# Patient Record
Sex: Male | Born: 1950 | Race: White | Hispanic: No | Marital: Married
Health system: Midwestern US, Community
[De-identification: ages and names within clinical notes are randomized; demographics above are authoritative.]

## PROBLEM LIST (undated history)

## (undated) DIAGNOSIS — I519 Heart disease, unspecified: Secondary | ICD-10-CM

## (undated) DIAGNOSIS — G610 Guillain-Barre syndrome: Secondary | ICD-10-CM

## (undated) DIAGNOSIS — G6181 Chronic inflammatory demyelinating polyneuritis: Secondary | ICD-10-CM

## (undated) DIAGNOSIS — N200 Calculus of kidney: Secondary | ICD-10-CM

## (undated) DIAGNOSIS — M109 Gout, unspecified: Secondary | ICD-10-CM

## (undated) DIAGNOSIS — N400 Enlarged prostate without lower urinary tract symptoms: Secondary | ICD-10-CM

## (undated) DIAGNOSIS — K2960 Other gastritis without bleeding: Secondary | ICD-10-CM

## (undated) DIAGNOSIS — H269 Unspecified cataract: Secondary | ICD-10-CM

## (undated) DIAGNOSIS — I219 Acute myocardial infarction, unspecified: Secondary | ICD-10-CM

## (undated) DIAGNOSIS — R259 Unspecified abnormal involuntary movements: Secondary | ICD-10-CM

## (undated) DIAGNOSIS — I25118 Atherosclerotic heart disease of native coronary artery with other forms of angina pectoris: Secondary | ICD-10-CM

## (undated) DIAGNOSIS — M545 Low back pain, unspecified: Secondary | ICD-10-CM

## (undated) DIAGNOSIS — R310 Gross hematuria: Secondary | ICD-10-CM

## (undated) DIAGNOSIS — I214 Non-ST elevation (NSTEMI) myocardial infarction: Principal | ICD-10-CM

## (undated) HISTORY — PX: CORONARY ANGIOPLASTY WITH STENT PLACEMENT: SHX49

## (undated) HISTORY — PX: EYE SURGERY: SHX253

---

## 2011-02-02 ENCOUNTER — Encounter

## 2014-07-20 DIAGNOSIS — R519 Headache, unspecified: Secondary | ICD-10-CM | POA: Insufficient documentation

## 2015-01-03 ENCOUNTER — Inpatient Hospital Stay: Admit: 2015-01-04 | Payer: TRICARE (CHAMPUS) | Primary: Internal Medicine

## 2015-01-03 DIAGNOSIS — R972 Elevated prostate specific antigen [PSA]: Secondary | ICD-10-CM

## 2016-02-07 ENCOUNTER — Emergency Department: Admit: 2016-02-07 | Payer: TRICARE (CHAMPUS) | Primary: Internal Medicine

## 2016-02-07 ENCOUNTER — Inpatient Hospital Stay
Admit: 2016-02-07 | Discharge: 2016-02-09 | Disposition: A | Discharge status: Home / Self Care | Payer: TRICARE (CHAMPUS) | Attending: Family Medicine | Admitting: Family Medicine

## 2016-02-07 ENCOUNTER — Inpatient Hospital Stay

## 2016-02-07 DIAGNOSIS — I214 Non-ST elevation (NSTEMI) myocardial infarction: Principal | ICD-10-CM

## 2016-02-07 LAB — D-DIMER, QUANTITATIVE: D-Dimer, Quant: 0.28 ug/ml(FEU) (ref <0.46)

## 2016-02-07 LAB — CBC WITH AUTOMATED DIFF
ABS. BASOPHILS: 0 10*3/uL (ref 0.0–0.06)
ABS. EOSINOPHILS: 0.2 10*3/uL (ref 0.0–0.4)
ABS. LYMPHOCYTES: 2.3 10*3/uL (ref 0.9–3.6)
ABS. MONOCYTES: 0.6 10*3/uL (ref 0.05–1.2)
ABS. NEUTROPHILS: 4.5 10*3/uL (ref 1.8–8.0)
BASOPHILS: 1 % (ref 0–2)
EOSINOPHILS: 3 % (ref 0–5)
HCT: 47.4 % (ref 36.0–48.0)
HGB: 17.1 g/dL — ABNORMAL HIGH (ref 13.0–16.0)
LYMPHOCYTES: 31 % (ref 21–52)
MCH: 32.1 PG (ref 24.0–34.0)
MCHC: 36.1 g/dL (ref 31.0–37.0)
MCV: 88.9 FL (ref 74.0–97.0)
MONOCYTES: 8 % (ref 3–10)
MPV: 9.2 FL (ref 9.2–11.8)
NEUTROPHILS: 57 % (ref 40–73)
PLATELET: 200 10*3/uL (ref 135–420)
RBC: 5.33 M/uL (ref 4.70–5.50)
RDW: 13.3 % (ref 11.6–14.5)
WBC: 7.6 10*3/uL (ref 4.6–13.2)

## 2016-02-07 LAB — LIPASE: Lipase: 172 U/L (ref 73–393)

## 2016-02-07 LAB — PTT: aPTT: 26.5 s (ref 23.0–36.4)

## 2016-02-07 LAB — METABOLIC PANEL, COMPREHENSIVE
A-G Ratio: 0.9 (ref 0.8–1.7)
ALT (SGPT): 29 U/L (ref 16–61)
AST (SGOT): 43 U/L — ABNORMAL HIGH (ref 15–37)
Albumin: 3.7 g/dL (ref 3.4–5.0)
Alk. phosphatase: 80 U/L (ref 45–117)
Anion gap: 10 mmol/L (ref 3.0–18)
BUN/Creatinine ratio: 14 (ref 12–20)
BUN: 17 MG/DL (ref 7.0–18)
Bilirubin, total: 0.4 MG/DL (ref 0.2–1.0)
CO2: 26 mmol/L (ref 21–32)
Calcium: 8.9 MG/DL (ref 8.5–10.1)
Chloride: 106 mmol/L (ref 100–108)
Creatinine: 1.21 MG/DL (ref 0.6–1.3)
GFR est AA: >60 mL/min/{1.73_m2} (ref >60)
GFR est non-AA: >60 mL/min/{1.73_m2} (ref >60)
Globulin: 3.9 g/dL (ref 2.0–4.0)
Glucose: 120 mg/dL — ABNORMAL HIGH (ref 74–99)
Potassium: 3.9 mmol/L (ref 3.5–5.5)
Protein, total: 7.6 g/dL (ref 6.4–8.2)
Sodium: 142 mmol/L (ref 136–145)

## 2016-02-07 LAB — CARDIAC PANEL,(CK, CKMB & TROPONIN)
CK - MB: 8.6 ng/ml — ABNORMAL HIGH (ref <3.6)
CK-MB Index: 3.2 % (ref 0.0–4.0)
CK: 269 U/L (ref 39–308)
Troponin-I, QT: 1.04 NG/ML — CR (ref 0.00–0.06)

## 2016-02-07 LAB — MAGNESIUM: Magnesium: 1.9 mg/dL (ref 1.6–2.6)

## 2016-02-07 LAB — D DIMER: D DIMER: 0.28 ug/ml(FEU) (ref <0.46)

## 2016-02-07 MED ORDER — METOPROLOL TARTRATE 25 MG TAB
25 mg | Freq: Once | ORAL | Status: DC
Start: 2016-02-07 — End: 2016-02-07
  Administered 2016-02-07: 22:00:00 via ORAL

## 2016-02-07 MED ORDER — NITROGLYCERIN 2 % TRANSDERMAL OINTMENT
2 % | Freq: Two times a day (BID) | TRANSDERMAL | Status: DC
Start: 2016-02-07 — End: 2016-02-09
  Administered 2016-02-07 – 2016-02-09 (×3): via TOPICAL

## 2016-02-07 MED ORDER — METOPROLOL TARTRATE 25 MG TAB
25 mg | Freq: Once | ORAL | Status: AC
Start: 2016-02-07 — End: 2016-02-07
  Administered 2016-02-07: 23:00:00 via ORAL

## 2016-02-07 MED ORDER — ACETAMINOPHEN-CODEINE 300 MG-30 MG TAB
300-30 mg | ORAL | Status: AC
Start: 2016-02-07 — End: 2016-02-07
  Administered 2016-02-07: via ORAL

## 2016-02-07 MED ORDER — ASPIRIN 81 MG CHEWABLE TAB
81 mg | Freq: Every day | ORAL | Status: DC
Start: 2016-02-07 — End: 2016-02-09
  Administered 2016-02-08 – 2016-02-09 (×2): via ORAL

## 2016-02-07 MED ORDER — HEPARIN (PORCINE) 1,000 UNIT/ML IJ SOLN
1000 unit/mL | Freq: Once | INTRAMUSCULAR | Status: AC
Start: 2016-02-07 — End: 2016-02-07
  Administered 2016-02-07: 23:00:00 via INTRAVENOUS

## 2016-02-07 MED ORDER — HEPARIN (PORCINE) IN D5W 25,000 UNIT/250 ML IV
25000 unit/250 mL(100 unit/mL) | INTRAVENOUS | Status: DC
Start: 2016-02-07 — End: 2016-02-08
  Administered 2016-02-07 – 2016-02-08 (×5): via INTRAVENOUS

## 2016-02-07 MED ORDER — ASPIRIN 81 MG CHEWABLE TAB
81 mg | ORAL | Status: AC
Start: 2016-02-07 — End: 2016-02-07
  Administered 2016-02-07: 23:00:00 via ORAL

## 2016-02-07 MED ORDER — NITROGLYCERIN 0.4 MG SUBLINGUAL TAB
0.4 mg | SUBLINGUAL | Status: DC | PRN
Start: 2016-02-07 — End: 2016-02-09

## 2016-02-07 MED ORDER — ATORVASTATIN 20 MG TAB
20 mg | ORAL | Status: AC
Start: 2016-02-07 — End: 2016-02-07
  Administered 2016-02-07: 23:00:00 via ORAL

## 2016-02-07 MED ORDER — METOPROLOL TARTRATE 25 MG TAB
25 mg | Freq: Two times a day (BID) | ORAL | Status: DC
Start: 2016-02-07 — End: 2016-02-09
  Administered 2016-02-08 – 2016-02-09 (×3): via ORAL

## 2016-02-07 MED ORDER — ATORVASTATIN 20 MG TAB
20 mg | Freq: Every evening | ORAL | Status: DC
Start: 2016-02-07 — End: 2016-02-09
  Administered 2016-02-09: 01:00:00 via ORAL

## 2016-02-07 MED FILL — ATORVASTATIN 20 MG TAB: 20 mg | ORAL | Qty: 2

## 2016-02-07 MED FILL — ASPIRIN 81 MG CHEWABLE TAB: 81 mg | ORAL | Qty: 4

## 2016-02-07 MED FILL — METOPROLOL TARTRATE 25 MG TAB: 25 mg | ORAL | Qty: 1

## 2016-02-07 MED FILL — NITRO-BID 2 % TRANSDERMAL OINTMENT: 2 % | TRANSDERMAL | Qty: 1

## 2016-02-07 MED FILL — HEPARIN (PORCINE) 1,000 UNIT/ML IJ SOLN: 1000 unit/mL | INTRAMUSCULAR | Qty: 10

## 2016-02-07 MED FILL — ACETAMINOPHEN-CODEINE 300 MG-30 MG TAB: 300-30 mg | ORAL | Qty: 1

## 2016-02-07 MED FILL — HEPARIN (PORCINE) IN D5W 25,000 UNIT/250 ML IV: 25000 unit/250 mL(100 unit/mL) | INTRAVENOUS | Qty: 250

## 2016-02-07 NOTE — Consults (Signed)
[facility] MEDICAL CENTER  CONSULTATIONS    Name:  Shane Mann, Shane Mann  MR#:  [id number]  DOB:  30-Dec-1950  Account #:  [id number]  Date of Adm:  02/07/2016  Date of Consultation:  02/07/2016      REASON FOR CONSULTATION: Non-ST-elevation MI and chest  pain.    HISTORY OF PRESENT ILLNESS: This patient is a 65 year old  gentleman with a history of gout, gastroesophageal reflux disease,  sleep apnea, and a remote history of GB syndrome, came to the  hospital with worsening of chest pain symptoms. Actually his  symptoms actually started 5 weeks ago when he has 3-5 minute  episode of chest pain that resolved, and then a week ago he started  having again frequent chest pain, then yesterday the chest pain lasted  for 45 minutes and the chest pain was precordial and sometimes  radiating to both shoulders, became worse on physical activity,  complaining of minimal dizziness. 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 The  patient has significant family history of coronary artery disease as well.  Denied any risk factor for DVT or pulmonary embolism.    PAST MEDICAL HISTORY: Significant for sleep apnea, Guillain-Barre  syndrome, gout, gastroesophageal reflux.    SURGICAL HISTORY: Significant for cataract surgery.    FAMILY HISTORY: Positive for a history of MI in mother and father, as  well as in the sister.    SOCIAL HISTORY: Denied any history of smoking or alcohol or drug  abuse.    REVIEW OF SYSTEMS: A 10-point review of systems completed,  positive pertinent findings discussed in history of present illness. The  rest of the systems are normal.    ALLERGIES: THE PATIENT HAS NO KNOWN DRUG ALLERGIES.    HOME MEDICATIONS INCLUDE  1. Neurontin 300 mg daily.  2. Ibuprofen.  3. Vitamin D3.  4. Cymbalta.  5. Colchicine.  6. Allopurinol.    PHYSICAL EXAMINATION  GENERAL: A 65 year old gentleman,  comfortable, not in any distress,  not in active chest pain at this point.  VITAL SIGNS: Heart rate is 77, respirations 15, temperature is 98.5,  blood pressure is 111/82 mmHg. O2 saturation is 94% to 100%.  HEENT: Head is atraumatic, normocephalic.  NECK: Supple. No JVD, no carotid bruits.  HEART: S1, S2 audible.  LUNGS: Bilateral air entry positive, no added sounds.  ABDOMEN: Soft, nontender. Bowel sounds audible.  EXTREMITIES: No edema. Pulses are palpable.  NEUROLOGIC: No focal motor or sensory deficit.  DERMATOLOGICAL: No skin rash.  MUSCULOSKELETAL: No obvious joint deformity. All the pulses are  palpable. There is no radial-radial and radial-femoral delay in the  pulses also.    LABORATORY DATA: EKG has shown sinus rhythm, possible inferior  infarct cannot be excluded.    Troponin is 1.0, and CK-MB is 8.6. Total CK is 269. Sodium 142,  potassium 3.9, creatinine is 1.21. Magnesium is 1.9. Hemoglobin is  17.1, platelet count is 200.    Chest x-ray is stable.    CT head was negative for any bleed.    ASSESSMENT  1. Non-ST elevation myocardial infarction with possible delayed  presentation, intermittent chest pain and troponin elevation.  2. Sleep apnea.  3. History of Guillain-Barre syndrome.    PLAN: I will start the patient on heparin ACS protocol. Will start on  metoprolol 12.5 b.i.d., will start Lipitor 40 mg daily,  aspirin daily. I will  get the echocardiogram. The patient is stable at this point and we will  plan for cardiac catheterization tomorrow. Risks, benefits and  alternatives were discussed in detail with the patient, wife, and  daughter, they all agreed to proceed, and I will follow the patient.  Treatment plan was discussed with Dr. Lucianne MussKumar.        Asa SaunasMASOOD Dymond Spreen, MD    MA / [email or web address]  D:  02/07/2016   19:35  T:  02/07/2016   20:25  Job #:  [id number]

## 2016-02-07 NOTE — ED Provider Notes (Signed)
[facility]  EMERGENCY DEPARTMENT HISTORY AND PHYSICAL EXAM       Date: 02/07/2016   Patient Name: Shane Mann   Date of Birth: 1951/04/16  Medical Record Number: [id number]    History of Presenting Illness     Chief Complaint   Patient presents with   ??? Chest Pain        History Provided By:  patient    Additional History: 3:58 PM   Shane Mann is a 65 y.o. male who presents to the emergency department C/O recurrent 3 minute episodes of left sided chest pain starting 5 weeks ago which have been increasingly more frequent and more severe. Pt's last episode of chest pain was this morning and lasted 45 minutes. This episode was associated with severe shortness of breath, diaphoresis, left sided facial numbness, and upper back pain. Associated sxs include cough starting 7 days ago, intermittent dizziness, and right foot swelling. Pt went to his PCP, Aaron Mose, DO, for his sxs earlier today and he was evaluated by EKG which found T wave abnormality. This is new compared to his prior EKGs. The patient was instructed to come to the ED for further evaluation. Denies blood thinner use, including ASA. Denies recent travel or hx of DVT. PMHx includes Guillain Barre syndrome. Denies cigarette use, etoh use, and illicit drug use. Denies fever, chills, speech difficulties, weakness, paralysis, and any other sxs or complaints.     Primary Care Provider: Harlene Ramus, MD   Specialist:    Past History     Past Medical History:   Past Medical History:   Diagnosis Date   ??? GERD (gastroesophageal reflux disease)    ??? Gout    ??? Guillain Barr?? syndrome ([place])    ??? Sleep apnea         Past Surgical History:   Past Surgical History:   Procedure Laterality Date   ??? HX CATARACT REMOVAL          Family History:   History reviewed. No pertinent family history.     Social History:   Social History   Substance Use Topics   ??? Smoking status: None   ??? Smokeless tobacco: None   ??? Alcohol use None         Allergies:   No Known Allergies     Review of Systems   Review of Systems   Constitutional: Positive for diaphoresis. Negative for chills and fever.   Respiratory: Positive for shortness of breath.    Cardiovascular: Positive for chest pain (left sided) and leg swelling (right foot).   Neurological: Positive for dizziness (intermittent) and numbness (left sided facial numbness). Negative for speech difficulty and weakness.        (-) paralysis   All other systems reviewed and are negative.      Physical Exam  Vitals:    02/07/16 1505 02/07/16 1615   BP: 157/90 142/70   Pulse: 99 86   Resp: 16 23   Temp: 98.5 ??F (36.9 ??C)    SpO2: 100% 93%   Weight: 107.5 kg (237 lb)    Height: 5' 11" (1.803 m)        Physical Exam   Constitutional: He is oriented to person, place, and time. He appears well-developed and well-nourished.   HENT:   Head: Normocephalic and atraumatic.   Right Ear: External ear normal.   Left Ear: External ear normal.   Nose: Nose normal.   Mouth/Throat:  Oropharynx is clear and moist.   Eyes: Conjunctivae and EOM are normal. Pupils are equal, round, and reactive to light.   Neck: Normal range of motion. Neck supple. No JVD present. No tracheal deviation present. No thyromegaly present.   Cardiovascular: Normal rate, regular rhythm, normal heart sounds and intact distal pulses.  Exam reveals no gallop and no friction rub.    No murmur heard.  Pulmonary/Chest: Effort normal. No respiratory distress. He has wheezes (scattered, diffuse). He has no rales.   Abdominal: Soft. Bowel sounds are normal. He exhibits no distension and no mass. There is no tenderness.   Obese, No HSM   Musculoskeletal: Normal range of motion. He exhibits no edema or tenderness.        Left ankle: He exhibits swelling (mild swelling with no tenderness, erythema, edema, or warmth).   Lymphadenopathy:     He has no cervical adenopathy.   Neurological: He is alert and oriented to person, place, and time. He has  normal reflexes. No cranial nerve deficit. He exhibits normal muscle tone. Coordination normal.   No focal weakness   Skin: Skin is warm and dry. No rash noted.   Psychiatric: He has a normal mood and affect. His behavior is normal. Thought content normal.   Nursing note and vitals reviewed.      Diagnostic Study Results     Labs -      Recent Results (from the past 12 hour(s))   EKG, 12 LEAD, INITIAL    Collection Time: 02/07/16  4:01 PM   Result Value Ref Range    Ventricular Rate 89 BPM    Atrial Rate 89 BPM    P-R Interval 162 ms    QRS Duration 84 ms    Q-T Interval 368 ms    QTC Calculation (Bezet) 447 ms    Calculated P Axis -3 degrees    Calculated R Axis 11 degrees    Calculated T Axis 78 degrees    Diagnosis       Normal sinus rhythm  Possible Inferior infarct , age undetermined  Abnormal ECG  No previous ECGs available     CBC WITH AUTOMATED DIFF    Collection Time: 02/07/16  4:15 PM   Result Value Ref Range    WBC 7.6 4.6 - 13.2 K/uL    RBC 5.33 4.70 - 5.50 M/uL    HGB 17.1 (H) 13.0 - 16.0 g/dL    HCT 47.4 36.0 - 48.0 %    MCV 88.9 74.0 - 97.0 FL    MCH 32.1 24.0 - 34.0 PG    MCHC 36.1 31.0 - 37.0 g/dL    RDW 13.3 11.6 - 14.5 %    PLATELET 200 135 - 420 K/uL    MPV 9.2 9.2 - 11.8 FL    NEUTROPHILS 57 40 - 73 %    LYMPHOCYTES 31 21 - 52 %    MONOCYTES 8 3 - 10 %    EOSINOPHILS 3 0 - 5 %    BASOPHILS 1 0 - 2 %    ABS. NEUTROPHILS 4.5 1.8 - 8.0 K/UL    ABS. LYMPHOCYTES 2.3 0.9 - 3.6 K/UL    ABS. MONOCYTES 0.6 0.05 - 1.2 K/UL    ABS. EOSINOPHILS 0.2 0.0 - 0.4 K/UL    ABS. BASOPHILS 0.0 0.0 - 0.06 K/UL    DF AUTOMATED     METABOLIC PANEL, COMPREHENSIVE    Collection Time: 02/07/16  4:15 PM   Result Value  Ref Range    Sodium 142 136 - 145 mmol/L    Potassium 3.9 3.5 - 5.5 mmol/L    Chloride 106 100 - 108 mmol/L    CO2 26 21 - 32 mmol/L    Anion gap 10 3.0 - 18 mmol/L    Glucose 120 (H) 74 - 99 mg/dL    BUN 17 7.0 - 18 MG/DL    Creatinine 1.21 0.6 - 1.3 MG/DL    BUN/Creatinine ratio 14 12 - 20       GFR est AA >60 >60 ml/min/1.59m    GFR est non-AA >60 >60 ml/min/1.746m   Calcium 8.9 8.5 - 10.1 MG/DL    Bilirubin, total 0.4 0.2 - 1.0 MG/DL    ALT (SGPT) 29 16 - 61 U/L    AST (SGOT) 43 (H) 15 - 37 U/L    Alk. phosphatase 80 45 - 117 U/L    Protein, total 7.6 6.4 - 8.2 g/dL    Albumin 3.7 3.4 - 5.0 g/dL    Globulin 3.9 2.0 - 4.0 g/dL    A-G Ratio 0.9 0.8 - 1.7     CARDIAC PANEL,(CK, CKMB & TROPONIN)    Collection Time: 02/07/16  4:15 PM   Result Value Ref Range    CK 269 39 - 308 U/L    CK - MB 8.6 (H) <3.6 ng/ml    CK-MB Index 3.2 0.0 - 4.0 %    Troponin-I, Qt. 1.04 (HH) 0.00 - 0.06 NG/ML   MAGNESIUM    Collection Time: 02/07/16  4:15 PM   Result Value Ref Range    Magnesium 1.9 1.6 - 2.6 mg/dL   LIPASE    Collection Time: 02/07/16  4:15 PM   Result Value Ref Range    Lipase 172 73 - 393 U/L   D DIMER    Collection Time: 02/07/16  4:15 PM   Result Value Ref Range    D DIMER 0.28 <0.46 ug/ml(FEU)       Radiologic Studies -  The following have been ordered and reviewed:  CT HEAD WO CONT   Final Result   IMPRESSION:  ??  1. No acute intracranial abnormalities are identified.   ??  2. Moderate mucosal thickening throughout the paranasal sinuses with near total  opacification of the frontal sinuses. Aerated mastoids. Correlate clinically for  sinusitis contributing to headaches.  As read by the radiologist.    XR CHEST PA LAT   Final Result   IMPRESSION:  ??  1. No acute pulmonary process.  As read by the radiologist.      Medical Decision Making   I am the first provider for this patient.     I reviewed the vital signs, available nursing notes, past medical history, past surgical history, family history and social history.     Vital Signs-Reviewed the patient's vital signs.   Patient Vitals for the past 12 hrs:   Temp Pulse Resp BP SpO2   02/07/16 1615 - 86 23 142/70 93 %   02/07/16 1505 98.5 ??F (36.9 ??C) 99 16 157/90 100 %       Pulse Oximetry Analysis - Normal 92% on room air     Cardiac Monitor:   Rate: 85 bpm   Rhythm: Normal Sinus Rhythm     EKG interpretation: (Preliminary)  Rhythm: NSR. Rate (approx.): 89 bpm; Possible inferior infarct, age undetermined. No STEMI. Slight change in T waves reverted back in V2. Compared to EKG from 02/07/16 at  13:54 PM.   EKG read by Robbie Lis, MD    Old Medical Records: Old medical records.  Previous electrocardiograms.  Nursing notes.     Provider Notes: Ddx: ACS, CHF, MI, PE, pneumonia, pneumothorax, AAA, dissection, esophageal spasms, GERD, anemia, uremia. Rule out: other cardiovascular, pulmonary, or GI pathology.    Facial symptoms may be due to cardiac etiology. Rule out intracranial pathology, sinusitis, mass    Procedures:   Procedures    ED Course:  3:58 PM  Initial assessment performed. The patients presenting problems have been discussed, and they are in agreement with the care plan formulated and outlined with them.  I have encouraged them to ask questions as they arise throughout their visit.    6:04 PM Discussed patient's history, exam, and available diagnostics results with Jeneen Rinks A. Grandville Silos, DO, internal medicine, who agrees to admit the patient to telemetry.    6:07 PM  Patient is being admitted to the hospital by Jeneen Rinks A. Grandville Silos, DO to Telemetry. The results of their tests and reasons for their admission have been discussed with them and/or available family. They convey agreement and understanding for the need to be admitted and for their admission diagnosis.      6:12 PM Discussed patient's history, exam, and available diagnostics results with Ival Bible, MD, cardiology, who would like the patient to be started on Metoprolol 12.22m, Lipitor 40 mg PO, 4 baby ASA, Heparin Bolus, Heparin drip, and admit to the hospitalist overnight. He will consult.     Medications Given in the ED:  Medications   aspirin chewable tablet 324 mg (not administered)   nitroglycerin (NITROSTAT) tablet 0.4 mg (not administered)    metoprolol tartrate (LOPRESSOR) tablet 12.5 mg (not administered)       Critical Care Time:  I have spent 40 minutes of critical care time involved in lab review, consultations with specialist, family decision-making, and documentation.  During this entire length of time I was immediately available to the patient.    Critical Care:  The reason for providing this level of medical care for this critically ill patient was due a critical illness that impaired one or more vital organ systems such that there was a high probability of imminent or life threatening deterioration in the patients condition. This care involved high complexity decision making to assess, manipulate, and support vital system functions, to treat this degreee vital organ system failure and to prevent further life threatening deterioration of the patient???s condition.     Diagnosis   Clinical Impression:     1. NSTEMI (non-ST elevated myocardial infarction) ([place]    2. Unstable angina pectoris ([place]       _______________________________   Attestations:     This note is prepared by CNed Clines acting as a Scribe for PRobbie Lis MD on 3:58 PM on 02/07/2016 .    PRobbie Lis MD: The scribe's documentation has been prepared under my direction and personally reviewed by me in its entirety.  _______________________________

## 2016-02-07 NOTE — Consults (Signed)
[facility] MEDICAL CENTER  CONSULTATIONS    Name:  Shane Mann, FRANEK  MR#:  [id number]  DOB:  Dec 29, 1950  Account #:  [id number]  Date of Adm:  02/07/2016  Date of Consultation:  02/07/2016      REASON FOR CONSULTATION: Non-ST-elevation MI and chest  pain.    HISTORY OF PRESENT ILLNESS: This patient is a 65 year old  gentleman with a history of gout, gastroesophageal reflux disease,  sleep apnea, and a remote history of GB syndrome, came to the  hospital with worsening of chest pain symptoms. Actually his  symptoms actually started 5 weeks ago when he has 3-5 minute  episode of chest pain that resolved, and then a week ago he started  having again frequent chest pain, then yesterday the chest pain lasted  for 45 minutes and the chest pain was precordial and sometimes  radiating to both shoulders, became worse on physical activity,  complaining of minimal dizziness. 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 The  patient has significant family history of coronary artery disease as well.  Denied any risk factor for DVT or pulmonary embolism.    PAST MEDICAL HISTORY: Significant for sleep apnea, Guillain-Barre  syndrome, gout, gastroesophageal reflux.    SURGICAL HISTORY: Significant for cataract surgery.    FAMILY HISTORY: Positive for a history of MI in mother and father, as  well as in the sister.    SOCIAL HISTORY: Denied any history of smoking or alcohol or drug  abuse.    REVIEW OF SYSTEMS: A 10-point review of systems completed,  positive pertinent findings discussed in history of present illness. The  rest of the systems are normal.    ALLERGIES: THE PATIENT HAS NO KNOWN DRUG ALLERGIES.    HOME MEDICATIONS INCLUDE  1. Neurontin 300 mg daily.  2. Ibuprofen.  3. Vitamin D3.  4. Cymbalta.  5. Colchicine.  6. Allopurinol.    PHYSICAL EXAMINATION   GENERAL: A 65 year old gentleman, comfortable, not in any distress,  not in active chest pain at this point.  VITAL SIGNS: Heart rate is 77, respirations 15, temperature is 98.5,  blood pressure is 111/82 mmHg. O2 saturation is 94% to 100%.  HEENT: Head is atraumatic, normocephalic.  NECK: Supple. No JVD, no carotid bruits.  HEART: S1, S2 audible.  LUNGS: Bilateral air entry positive, no added sounds.  ABDOMEN: Soft, nontender. Bowel sounds audible.  EXTREMITIES: No edema. Pulses are palpable.  NEUROLOGIC: No focal motor or sensory deficit.  DERMATOLOGICAL: No skin rash.  MUSCULOSKELETAL: No obvious joint deformity. All the pulses are  palpable. There is no radial-radial and radial-femoral delay in the  pulses also.    LABORATORY DATA: EKG has shown sinus rhythm, possible inferior  infarct cannot be excluded.    Troponin is 1.0, and CK-MB is 8.6. Total CK is 269. Sodium 142,  potassium 3.9, creatinine is 1.21. Magnesium is 1.9. Hemoglobin is  17.1, platelet count is 200.    Chest x-ray is stable.    CT head was negative for any bleed.    ASSESSMENT  1. Non-ST elevation myocardial infarction with possible delayed  presentation, intermittent chest pain and troponin elevation.  2. Sleep apnea.  3. History of Guillain-Barre syndrome.    PLAN: I will start the patient on heparin ACS protocol. Will start on  metoprolol 12.5 b.i.d., will start Lipitor 40 mg daily,  aspirin daily. I will  get the echocardiogram. The patient is stable at this point and we will  plan for cardiac catheterization tomorrow. Risks, benefits and  alternatives were discussed in detail with the patient, wife, and  daughter, they all agreed to proceed, and I will follow the patient.  Treatment plan was discussed with Dr. Lucianne MussKumar.        Asa SaunasMASOOD Emmitte Surgeon, MD    MA / [email or web address]  D:  02/07/2016   19:35  T:  02/07/2016   20:25  Job #:  [id number]

## 2016-02-07 NOTE — ED Notes (Signed)
Dr Ninfa Lindenahmad at [facility]side examiing pt and discussing plan of care

## 2016-02-07 NOTE — ED Notes (Signed)
Sepsis Screening completed    (  )Patient meets SIRS criteria.  ( x )Patient does not meet SIRS criteria.      SIRS Criteria is achieved when two or more of the following are present  ? Temperature < 96.8??F (36??C) or > 100.9??F (38.3??C)  ? Heart Rate > 90 beats per minute  ? Respiratory Rate > 20 breaths per minute  ? WBC count > 12,000 or <4,000 or > 10% bands

## 2016-02-07 NOTE — H&P (Signed)
History and Physical    Patient: Shane Mann               Sex: male          DOA: 02/07/2016       Date of Birth:  02/27/1951      Age:  65 y.o.            Assessment/Plan     NSTEMI with exertional angina  Tn=1, with active chest pain and EKG changes  Bed rest, serial Tn and EKG, Tele, TTE  Heparin gtt  ASA, BB, Statin  Cardiology consulting Imminent cardiac cath    OSA-CPAP  Gout - hold allopurinol, colcrys  ED - on PDE5s none recent  GERD - PPI  Hx of GBS with neuropathy - resume neurontin and     Code status: Full  PPX:  DVT: Hepgtt   GI: Pepcid PO    HPI:     Chief Complaint   Patient presents with   ??? Chest Pain       Shane Mann is a 65 y.o. male with PMHX of OSA, Gout, ED, GERD, and GBS with neuropathy who presents from Dr Rubbie Battiest office this afternoon. He reports a long and worrysome history of worsening substernal chest pain which began 5 weeks ago while working outside.  2 wks ago the sub sternal pain was more prominent, radiating to his back and lasting 3-5 minutes. Yesterday the pain began getting severe with associated DOE.  Today was the worst when he had an hour of post exertional pain  After going up steps associated with light headedness.  He was seen in Dr Rubbie Battiest office and ECG x1 performed and immediately sent over to [facility] for evaluation.  ECG in Dr Rubbie Battiest office 1:50pm shows 0.61m ST elevation and biphasic t waves in V1-V2  with persistent q waves in III and aVF.  These ST changes are gone on initial ECG in ER at 4pm, [facility]        Past Medical History:   Diagnosis Date   ??? GERD (gastroesophageal reflux disease)    ??? Gout    ??? Guillain Barr?? syndrome ([facility]Knox    ??? Sleep apnea        Prior to Admission Medications   Prescriptions Last Dose Informant Patient Reported? Taking?   DULoxetine (CYMBALTA) 30 mg capsule   Yes Yes   Sig: Take 30 mg by mouth two (2) times a day.   allopurinol (ZYLOPRIM) 100 mg tablet   Yes Yes    Sig: Take 200 mg by mouth daily.   cholecalciferol, vitamin D3, (VITAMIN D3) 2,000 unit tab   Yes Yes   Sig: Take 2,000 Int'l Units by mouth daily.   colchicine (COLCRYS) 0.6 mg tablet   Yes Yes   Sig: Take 0.6 mg by mouth daily.   gabapentin (NEURONTIN) 300 mg capsule   Yes Yes   Sig: Take 300 mg by mouth four (4) times daily.   ibuprofen (MOTRIN) 400 mg tablet   Yes Yes   Sig: Take 400 mg by mouth every six (6) hours as needed for Pain.      Facility-Administered Medications: None       Social History:  Social History     Social History   ??? Marital status: MARRIED     Spouse name: N/A   ??? Number of children: N/A   ??? Years of  education: N/A     Occupational History   ??? Not on file.     Social History Main Topics   ??? Smoking status: Not on file   ??? Smokeless tobacco: Not on file   ??? Alcohol use Not on file   ??? Drug use: Not on file   ??? Sexual activity: Not on file     Other Topics Concern   ??? Not on file     Social History Narrative   ??? No narrative on file       Family History:  History reviewed. No pertinent family history.    Surgical History:  Past Surgical History:   Procedure Laterality Date   ??? HX CATARACT REMOVAL         Review of Systems  Constitutional:  No fever or weight loss  HEENT:  No headache or visual changes  Cardiovascular: HPI  Respiratory:  No coughing, wheezing, or shortness of breath.  GI:  No nausea or vomitting.  No diarrhea  GU:  No hematuria or dysuria  Skin:  No rashes or moles  Neuro:  No seizures or syncope  Hematological:  No bruising or bleeding  Endocrine:  No diabetes or thyroid disease    Physical Exam:      Visit Vitals   ??? BP 136/81 (BP 1 Location: Left arm, BP Patient Position: At rest)   ??? Pulse 76   ??? Temp 97.9 ??F (36.6 ??C)   ??? Resp 16   ??? Ht '5\' 11"'$  (1.803 m)   ??? Wt 107.5 kg (237 lb)   ??? SpO2 96%   ??? BMI 33.05 kg/m2       Physical Exam:  Gen:  No distress, alert  HEENT:  Normal cephalic atraumatic, extra-occular movements are intact.  Neck:  Supple, No JVD   Lungs:  Clear bilaterally, no wheeze, no rales, normal effort  Heart:  Regular Rate and Rhythm, normal S1 and S2, no edema  Abdomen:  Soft, non tender, normal bowel sounds, no guarding.  Extremities:  Well perfused, no cyanosis or edema  Neurological:  Awake and alert, CN's are intact, normal strength throughout extremities  Skin:  No rashes or moles  Psych:  Normal thought process, does not appear anxious    Laboratory Studies:  All lab results for the last 24 hours reviewed.  Recent Results (from the past 12 hour(s))   EKG, 12 LEAD, INITIAL    Collection Time: 02/07/16  4:01 PM   Result Value Ref Range    Ventricular Rate 89 BPM    Atrial Rate 89 BPM    P-R Interval 162 ms    QRS Duration 84 ms    Q-T Interval 368 ms    QTC Calculation (Bezet) 447 ms    Calculated P Axis -3 degrees    Calculated R Axis 11 degrees    Calculated T Axis 78 degrees    Diagnosis       Normal sinus rhythm  Possible Inferior infarct , age undetermined  Abnormal ECG  No previous ECGs available     CBC WITH AUTOMATED DIFF    Collection Time: 02/07/16  4:15 PM   Result Value Ref Range    WBC 7.6 4.6 - 13.2 K/uL    RBC 5.33 4.70 - 5.50 M/uL    HGB 17.1 (H) 13.0 - 16.0 g/dL    HCT 47.4 36.0 - 48.0 %    MCV 88.9 74.0 - 97.0 FL    MCH 32.1  24.0 - 34.0 PG    MCHC 36.1 31.0 - 37.0 g/dL    RDW 13.3 11.6 - 14.5 %    PLATELET 200 135 - 420 K/uL    MPV 9.2 9.2 - 11.8 FL    NEUTROPHILS 57 40 - 73 %    LYMPHOCYTES 31 21 - 52 %    MONOCYTES 8 3 - 10 %    EOSINOPHILS 3 0 - 5 %    BASOPHILS 1 0 - 2 %    ABS. NEUTROPHILS 4.5 1.8 - 8.0 K/UL    ABS. LYMPHOCYTES 2.3 0.9 - 3.6 K/UL    ABS. MONOCYTES 0.6 0.05 - 1.2 K/UL    ABS. EOSINOPHILS 0.2 0.0 - 0.4 K/UL    ABS. BASOPHILS 0.0 0.0 - 0.06 K/UL    DF AUTOMATED     METABOLIC PANEL, COMPREHENSIVE    Collection Time: 02/07/16  4:15 PM   Result Value Ref Range    Sodium 142 136 - 145 mmol/L    Potassium 3.9 3.5 - 5.5 mmol/L    Chloride 106 100 - 108 mmol/L    CO2 26 21 - 32 mmol/L    Anion gap 10 3.0 - 18 mmol/L     Glucose 120 (H) 74 - 99 mg/dL    BUN 17 7.0 - 18 MG/DL    Creatinine 1.21 0.6 - 1.3 MG/DL    BUN/Creatinine ratio 14 12 - 20      GFR est AA >60 >60 ml/min/1.49m    GFR est non-AA >60 >60 ml/min/1.755m   Calcium 8.9 8.5 - 10.1 MG/DL    Bilirubin, total 0.4 0.2 - 1.0 MG/DL    ALT (SGPT) 29 16 - 61 U/L    AST (SGOT) 43 (H) 15 - 37 U/L    Alk. phosphatase 80 45 - 117 U/L    Protein, total 7.6 6.4 - 8.2 g/dL    Albumin 3.7 3.4 - 5.0 g/dL    Globulin 3.9 2.0 - 4.0 g/dL    A-G Ratio 0.9 0.8 - 1.7     CARDIAC PANEL,(CK, CKMB & TROPONIN)    Collection Time: 02/07/16  4:15 PM   Result Value Ref Range    CK 269 39 - 308 U/L    CK - MB 8.6 (H) <3.6 ng/ml    CK-MB Index 3.2 0.0 - 4.0 %    Troponin-I, Qt. 1.04 (HH) 0.00 - 0.06 NG/ML   MAGNESIUM    Collection Time: 02/07/16  4:15 PM   Result Value Ref Range    Magnesium 1.9 1.6 - 2.6 mg/dL   LIPASE    Collection Time: 02/07/16  4:15 PM   Result Value Ref Range    Lipase 172 73 - 393 U/L   D DIMER    Collection Time: 02/07/16  4:15 PM   Result Value Ref Range    D DIMER 0.28 <0.46 ug/ml(FEU)   PTT    Collection Time: 02/07/16  4:15 PM   Result Value Ref Range    aPTT 26.5 23.0 - 36.4 SEC       Rad:  CT head wnl  CXR -nl heart lung and mediastinum    50 minutes of critical care time spent in the direct evaluation and treatment of this high risk patient. The reason for providing this level of medical care for this critically ill patient was due a critical illness that impaired one or more vital organ systems such that there was a high probability of imminent  or life threatening deterioration in the patients condition. This care involved high complexity decision making to assess, manipulate, and support vital system functions, to treat this degreee vital organ system failure and to prevent further life threatening deterioration of the patient???s condition.

## 2016-02-07 NOTE — ED Notes (Signed)
TRANSFER - OUT REPORT:    Verbal report given to MGM MIRAGEMarcy RN  (name) on Shane Mann  being transferred to tele  (unit) for routine progression of care       Report consisted of patient???s Situation, Background, Assessment and   Recommendations(SBAR).     Information from the following report(s) SBAR, ED Summary, [facility] and Cardiac Rhythm Sinus rhythm was reviewed with the receiving nurse.    Lines:   Peripheral IV 02/07/16 Left Arm (Active)   Site Assessment Clean, dry, & intact 02/07/2016  4:19 PM   Phlebitis Assessment 0 02/07/2016  4:19 PM   Infiltration Assessment 0 02/07/2016  4:19 PM   Dressing Status Clean, dry, & intact 02/07/2016  4:19 PM   Dressing Type Transparent 02/07/2016  4:19 PM   Hub Color/Line Status Pink 02/07/2016  4:19 PM        Opportunity for questions and clarification was provided.      Patient transported with:

## 2016-02-07 NOTE — Other (Signed)
TRANSFER - IN REPORT:    Verbal report received from [facility] (name) on Shane Mann  being received from ED (unit) for routine progression of care      Report consisted of patient???s Situation, Background, Assessment and   Recommendations(SBAR).     Information from the following report(s) SBAR, ED Summary, Intake/Output, MAR, Recent Results and Cardiac Rhythm SR was reviewed with the receiving nurse.    Opportunity for questions and clarification was provided.      Assessment completed upon patient???s arrival to unit and care assumed.

## 2016-02-07 NOTE — ED Triage Notes (Signed)
Left side chest pain and sent by dr Peyton BottomsBrowder for t wave abnormality in ekg

## 2016-02-08 ENCOUNTER — Encounter: Primary: Internal Medicine

## 2016-02-08 LAB — ECHOCARDIOGRAM COMPLETE 2D W DOPPLER W COLOR: Left Ventricular Ejection Fraction: 50

## 2016-02-08 LAB — CBC W/O DIFF
HCT: 48.3 % — ABNORMAL HIGH (ref 36.0–48.0)
HCT: 45.6 % (ref 36.0–48.0)
HGB: 17.4 g/dL — ABNORMAL HIGH (ref 13.0–16.0)
HGB: 16.1 g/dL — ABNORMAL HIGH (ref 13.0–16.0)
MCH: 32.4 PG (ref 24.0–34.0)
MCH: 31.8 PG (ref 24.0–34.0)
MCHC: 36 g/dL (ref 31.0–37.0)
MCHC: 35.3 g/dL (ref 31.0–37.0)
MCV: 89.9 FL (ref 74.0–97.0)
MCV: 90.1 FL (ref 74.0–97.0)
MPV: 9 FL — ABNORMAL LOW (ref 9.2–11.8)
MPV: 9.4 FL (ref 9.2–11.8)
PLATELET: 216 10*3/uL (ref 135–420)
PLATELET: 185 10*3/uL (ref 135–420)
RBC: 5.37 M/uL (ref 4.70–5.50)
RBC: 5.06 M/uL (ref 4.70–5.50)
RDW: 13.4 % (ref 11.6–14.5)
RDW: 13.5 % (ref 11.6–14.5)
WBC: 9.7 10*3/uL (ref 4.6–13.2)
WBC: 9.3 10*3/uL (ref 4.6–13.2)

## 2016-02-08 LAB — TROPONIN I
Troponin-I, QT: 1.4 NG/ML — CR (ref 0.00–0.06)
Troponin-I, QT: 1.4 NG/ML — CR (ref 0.00–0.06)

## 2016-02-08 LAB — METABOLIC PANEL, BASIC
Anion gap: 9 mmol/L (ref 3.0–18)
BUN/Creatinine ratio: 16 (ref 12–20)
BUN: 18 MG/DL (ref 7.0–18)
CO2: 28 mmol/L (ref 21–32)
Calcium: 8.8 MG/DL (ref 8.5–10.1)
Chloride: 105 mmol/L (ref 100–108)
Creatinine: 1.15 MG/DL (ref 0.6–1.3)
GFR est AA: >60 mL/min/{1.73_m2} (ref >60)
GFR est non-AA: >60 mL/min/{1.73_m2} (ref >60)
Glucose: 120 mg/dL — ABNORMAL HIGH (ref 74–99)
Potassium: 3.7 mmol/L (ref 3.5–5.5)
Sodium: 142 mmol/L (ref 136–145)

## 2016-02-08 LAB — TSH 3RD GENERATION: TSH: 2.68 u[IU]/mL (ref 0.36–3.74)

## 2016-02-08 LAB — POC ACTIVATED CLOTTING TIME: Activated Clotting Time (POC): 380 SECS — ABNORMAL HIGH (ref 79–138)

## 2016-02-08 LAB — HEMOGLOBIN A1C WITH EAG
Est. average glucose: 123 mg/dL
Hemoglobin A1c: 5.9 % — ABNORMAL HIGH (ref 4.5–5.6)

## 2016-02-08 LAB — LIPID PANEL
CHOL/HDL Ratio: 6.1 — ABNORMAL HIGH (ref 0–5.0)
Cholesterol, total: 172 MG/DL (ref <200)
HDL Cholesterol: 28 MG/DL — ABNORMAL LOW (ref 40–60)
LDL, calculated: 99.2 MG/DL (ref 0–100)
Triglyceride: 224 MG/DL — ABNORMAL HIGH (ref <150)
VLDL, calculated: 44.8 MG/DL

## 2016-02-08 LAB — PTT: aPTT: 48.4 s — ABNORMAL HIGH (ref 23.0–36.4)

## 2016-02-08 MED ORDER — IOPAMIDOL 51 % IV SOLN
250 mg iodine /mL (51 %) | INTRAVENOUS | Status: DC | PRN
Start: 2016-02-08 — End: 2016-02-08
  Administered 2016-02-08: 20:00:00

## 2016-02-08 MED ORDER — SODIUM CHLORIDE 0.9 % IV PIGGY BACK
INTRAVENOUS | Status: AC
Start: 2016-02-08 — End: 2016-02-09
  Administered 2016-02-08: 20:00:00

## 2016-02-08 MED ORDER — HYDROCODONE-ACETAMINOPHEN 5 MG-325 MG TAB
5-325 mg | ORAL | Status: DC | PRN
Start: 2016-02-08 — End: 2016-02-09
  Administered 2016-02-08: 10:00:00 via ORAL

## 2016-02-08 MED ORDER — VERAPAMIL 2.5 MG/ML IV
2.5 mg/mL | INTRAVENOUS | Status: AC
Start: 2016-02-08 — End: 2016-02-09
  Administered 2016-02-08: 17:00:00

## 2016-02-08 MED ORDER — BIVALIRUDIN 250 MG SOLUTION
250 mg | INTRAVENOUS | Status: AC
Start: 2016-02-08 — End: 2016-02-09
  Administered 2016-02-08: 19:00:00

## 2016-02-08 MED ORDER — SODIUM CHLORIDE 0.9 % IJ SYRG
Freq: Three times a day (TID) | INTRAMUSCULAR | Status: DC
Start: 2016-02-08 — End: 2016-02-09
  Administered 2016-02-09 (×2): via INTRAVENOUS

## 2016-02-08 MED ORDER — EPTIFIBATIDE 2 MG/ML IV SOLN
2 mg/mL | INTRAVENOUS | Status: DC | PRN
Start: 2016-02-08 — End: 2016-02-08
  Administered 2016-02-08: 20:00:00 via INTRAVENOUS

## 2016-02-08 MED ORDER — GABAPENTIN 300 MG CAP
300 mg | Freq: Four times a day (QID) | ORAL | Status: DC
Start: 2016-02-08 — End: 2016-02-09
  Administered 2016-02-08 – 2016-02-09 (×5): via ORAL

## 2016-02-08 MED ORDER — MORPHINE 4 MG/ML SYRINGE
4 mg/mL | INTRAMUSCULAR | Status: AC
Start: 2016-02-08 — End: 2016-02-08
  Administered 2016-02-08: 19:00:00 via INTRAVENOUS

## 2016-02-08 MED ORDER — PANTOPRAZOLE 40 MG IV SOLR
40 mg | Freq: Once | INTRAVENOUS | Status: AC
Start: 2016-02-08 — End: 2016-02-08
  Administered 2016-02-08: 20:00:00 via INTRAVENOUS

## 2016-02-08 MED ORDER — SODIUM CHLORIDE 0.9 % IV PIGGY BACK
INTRAVENOUS | Status: AC
Start: 2016-02-08 — End: 2016-02-09
  Administered 2016-02-08: 18:00:00

## 2016-02-08 MED ORDER — METOPROLOL TARTRATE 5 MG/5 ML IV SOLN
5 mg/ mL | INTRAVENOUS | Status: AC
Start: 2016-02-08 — End: 2016-02-08
  Administered 2016-02-08: 19:00:00 via INTRAVENOUS

## 2016-02-08 MED ORDER — MAGNESIUM HYDROXIDE 400 MG/5 ML ORAL SUSP
400 mg/5 mL | Freq: Every day | ORAL | Status: DC | PRN
Start: 2016-02-08 — End: 2016-02-09

## 2016-02-08 MED ORDER — BIVALIRUDIN 250 MG SOLUTION
250 mg | INTRAVENOUS | Status: AC
Start: 2016-02-08 — End: 2016-02-09
  Administered 2016-02-08: 20:00:00

## 2016-02-08 MED ORDER — EPTIFIBATIDE 2 MG/ML IV SOLN
2 mg/mL | INTRAVENOUS | Status: AC
Start: 2016-02-08 — End: 2016-02-08
  Administered 2016-02-08: 20:00:00 via INTRAVENOUS

## 2016-02-08 MED ORDER — LIDOCAINE HCL 1 % (10 MG/ML) IJ SOLN
10 mg/mL (1 %) | INTRAMUSCULAR | Status: DC | PRN
Start: 2016-02-08 — End: 2016-02-08

## 2016-02-08 MED ORDER — FENTANYL CITRATE (PF) 50 MCG/ML IJ SOLN
50 mcg/mL | INTRAMUSCULAR | Status: AC
Start: 2016-02-08 — End: 2016-02-08
  Administered 2016-02-08: 19:00:00 via INTRAVENOUS

## 2016-02-08 MED ORDER — BIVALIRUDIN (ANGIOMAX) 5 MG/ML BOLUS NC
5 mg/mL | Freq: Once | INTRAVENOUS | Status: AC
Start: 2016-02-08 — End: 2016-02-08

## 2016-02-08 MED ORDER — NITROGLYCERIN 0.1 MG/ML (100 MCG/ML) D5W COMPOUNDED INJECTION
0.1 mg/mL | INTRAVENOUS | Status: AC
Start: 2016-02-08 — End: 2016-02-09
  Administered 2016-02-08: 17:00:00 via INTRACORONARY

## 2016-02-08 MED ORDER — PHENYLEPHRINE 10 MG/ML INJECTION
10 mg/mL | INTRAMUSCULAR | Status: AC
Start: 2016-02-08 — End: 2016-02-08
  Administered 2016-02-08: 19:00:00 via INTRAVENOUS

## 2016-02-08 MED ORDER — VERAPAMIL 2.5 MG/ML IV
2.5 mg/mL | INTRAVENOUS | Status: DC | PRN
Start: 2016-02-08 — End: 2016-02-08
  Administered 2016-02-08: 18:00:00 via INTRA_ARTERIAL

## 2016-02-08 MED ORDER — HEPARIN (PORCINE) 1,000 UNIT/ML IJ SOLN
1000 unit/mL | INTRAMUSCULAR | Status: AC
Start: 2016-02-08 — End: 2016-02-09

## 2016-02-08 MED ORDER — DULOXETINE 30 MG CAP, DELAYED RELEASE
30 mg | Freq: Two times a day (BID) | ORAL | Status: DC
Start: 2016-02-08 — End: 2016-02-09
  Administered 2016-02-08 – 2016-02-09 (×4): via ORAL

## 2016-02-08 MED ORDER — MORPHINE 4 MG/ML SYRINGE
4 mg/mL | INTRAMUSCULAR | Status: DC | PRN
Start: 2016-02-08 — End: 2016-02-08
  Administered 2016-02-08: 19:00:00 via INTRAVENOUS

## 2016-02-08 MED ORDER — HEPARIN (PORCINE) IN NS (PF) 1,000 UNIT/500 ML IV
1000 unit/500 mL | INTRAVENOUS | Status: DC | PRN
Start: 2016-02-08 — End: 2016-02-08

## 2016-02-08 MED ORDER — SODIUM CHLORIDE 0.9 % IV PIGGY BACK
INTRAVENOUS | Status: AC
Start: 2016-02-08 — End: 2016-02-09
  Administered 2016-02-08: 19:00:00

## 2016-02-08 MED ORDER — LIDOCAINE HCL 1 % (10 MG/ML) IJ SOLN
10 mg/mL (1 %) | INTRAMUSCULAR | Status: AC
Start: 2016-02-08 — End: 2016-02-08
  Administered 2016-02-08: 18:00:00 via INTRADERMAL

## 2016-02-08 MED ORDER — NITROGLYCERIN IN D5W 100 MG/250 ML (0.4 MG/ML) IV
100 mg/250 mL (400 mcg/mL) | INTRAVENOUS | Status: DC
Start: 2016-02-08 — End: 2016-02-09
  Administered 2016-02-08: 19:00:00 via INTRAVENOUS

## 2016-02-08 MED ORDER — SODIUM CHLORIDE 0.9 % IV
INTRAVENOUS | Status: DC
Start: 2016-02-08 — End: 2016-02-09
  Administered 2016-02-08 (×2): via INTRAVENOUS

## 2016-02-08 MED ORDER — HEPARIN (PORCINE) IN NS (PF) 1,000 UNIT/500 ML IV
1000 unit/500 mL | INTRAVENOUS | Status: AC
Start: 2016-02-08 — End: 2016-02-08
  Administered 2016-02-08: 19:00:00 via INTRA_ARTERIAL

## 2016-02-08 MED ORDER — SODIUM CHLORIDE 0.9 % IJ SYRG
INTRAMUSCULAR | Status: DC | PRN
Start: 2016-02-08 — End: 2016-02-09

## 2016-02-08 MED ORDER — BIVALIRUDIN 250 MG SOLUTION
250 mg | INTRAVENOUS | Status: AC
Start: 2016-02-08 — End: 2016-02-08
  Administered 2016-02-08: 18:00:00 via INTRAVENOUS

## 2016-02-08 MED ORDER — FENTANYL CITRATE (PF) 50 MCG/ML IJ SOLN
50 mcg/mL | INTRAMUSCULAR | Status: AC
Start: 2016-02-08 — End: 2016-02-08
  Administered 2016-02-08: 18:00:00 via INTRAVENOUS

## 2016-02-08 MED ORDER — HEPARIN (PORCINE) 1,000 UNIT/ML IJ SOLN
1000 unit/mL | INTRAMUSCULAR | Status: DC | PRN
Start: 2016-02-08 — End: 2016-02-08
  Administered 2016-02-08: 20:00:00 via INTRAVENOUS

## 2016-02-08 MED ORDER — DOCUSATE SODIUM 100 MG CAP
100 mg | Freq: Two times a day (BID) | ORAL | Status: DC
Start: 2016-02-08 — End: 2016-02-09
  Administered 2016-02-08 – 2016-02-09 (×4): via ORAL

## 2016-02-08 MED ORDER — PANTOPRAZOLE 40 MG IV SOLR
40 mg | INTRAVENOUS | Status: AC
Start: 2016-02-08 — End: 2016-02-09
  Administered 2016-02-08: 20:00:00

## 2016-02-08 MED ORDER — TICAGRELOR 90 MG TAB
90 mg | ORAL | Status: AC
Start: 2016-02-08 — End: 2016-02-08
  Administered 2016-02-08: 20:00:00 via ORAL

## 2016-02-08 MED ORDER — HEPARIN (PORCINE) IN NS (PF) 1,000 UNIT/500 ML IV
1000 unit/500 mL | INTRAVENOUS | Status: AC
Start: 2016-02-08 — End: 2016-02-08
  Administered 2016-02-08: 18:00:00 via INTRA_ARTERIAL

## 2016-02-08 MED ORDER — NITROGLYCERIN 0.1 MG/ML (100 MCG/ML) D5W COMPOUNDED INJECTION
0.1 mg/mL | INTRAVENOUS | Status: DC | PRN
Start: 2016-02-08 — End: 2016-02-08

## 2016-02-08 MED ORDER — TICAGRELOR 90 MG TAB
90 mg | Freq: Two times a day (BID) | ORAL | Status: DC
Start: 2016-02-08 — End: 2016-02-09
  Administered 2016-02-09 (×2): via ORAL

## 2016-02-08 MED ORDER — FENTANYL CITRATE (PF) 50 MCG/ML IJ SOLN
50 mcg/mL | INTRAMUSCULAR | Status: DC | PRN
Start: 2016-02-08 — End: 2016-02-08
  Administered 2016-02-08 (×4): via INTRAVENOUS

## 2016-02-08 MED ORDER — ONDANSETRON 4 MG TAB, RAPID DISSOLVE
4 mg | Freq: Four times a day (QID) | ORAL | Status: DC | PRN
Start: 2016-02-08 — End: 2016-02-09

## 2016-02-08 MED ORDER — SODIUM CHLORIDE 0.9 % IV PIGGY BACK
250 mg | INTRAVENOUS | Status: DC
Start: 2016-02-08 — End: 2016-02-09
  Administered 2016-02-08 (×3): via INTRAVENOUS

## 2016-02-08 MED ORDER — MIDAZOLAM 1 MG/ML IJ SOLN
1 mg/mL | INTRAMUSCULAR | Status: AC
Start: 2016-02-08 — End: 2016-02-08
  Administered 2016-02-08: 19:00:00 via INTRAVENOUS

## 2016-02-08 MED ORDER — ENOXAPARIN 40 MG/0.4 ML SUB-Q SYRINGE
40 mg/0.4 mL | SUBCUTANEOUS | Status: DC
Start: 2016-02-08 — End: 2016-02-09
  Administered 2016-02-09: 01:00:00 via SUBCUTANEOUS

## 2016-02-08 MED ORDER — PHENYLEPHRINE 10 MG/ML INJECTION
10 mg/mL | INTRAMUSCULAR | Status: DC | PRN
Start: 2016-02-08 — End: 2016-02-08

## 2016-02-08 MED ORDER — MIDAZOLAM 1 MG/ML IJ SOLN
1 mg/mL | INTRAMUSCULAR | Status: DC | PRN
Start: 2016-02-08 — End: 2016-02-08
  Administered 2016-02-08 (×3): via INTRAVENOUS

## 2016-02-08 MED ORDER — HEPARIN (PORCINE) 1,000 UNIT/ML IJ SOLN
1000 unit/mL | Freq: Once | INTRAMUSCULAR | Status: AC
Start: 2016-02-08 — End: 2016-02-08
  Administered 2016-02-08: 08:00:00 via INTRAVENOUS

## 2016-02-08 MED ORDER — TICAGRELOR 90 MG TAB
90 mg | ORAL | Status: AC
Start: 2016-02-08 — End: 2016-02-08

## 2016-02-08 MED ORDER — NITROGLYCERIN IN D5W 100 MG/250 ML (0.4 MG/ML) IV
100 mg/250 mL (400 mcg/mL) | INTRAVENOUS | Status: AC
Start: 2016-02-08 — End: 2016-02-08
  Administered 2016-02-08: 19:00:00 via INTRAVENOUS

## 2016-02-08 MED ORDER — SODIUM CHLORIDE 0.9 % IV
INTRAVENOUS | Status: DC
Start: 2016-02-08 — End: 2016-02-09
  Administered 2016-02-08: 17:00:00 via INTRAVENOUS

## 2016-02-08 MED ORDER — SODIUM CHLORIDE 0.9 % INJECTION
INTRAMUSCULAR | Status: AC
Start: 2016-02-08 — End: 2016-02-09
  Administered 2016-02-08: 20:00:00

## 2016-02-08 MED ORDER — METOPROLOL TARTRATE 5 MG/5 ML IV SOLN
5 mg/ mL | INTRAVENOUS | Status: DC | PRN
Start: 2016-02-08 — End: 2016-02-08

## 2016-02-08 MED ORDER — MORPHINE 4 MG/ML SYRINGE
4 mg/mL | INTRAMUSCULAR | Status: DC | PRN
Start: 2016-02-08 — End: 2016-02-09
  Administered 2016-02-08 – 2016-02-09 (×3): via INTRAVENOUS

## 2016-02-08 MED ORDER — SODIUM CHLORIDE 0.9 % IJ SYRG
Freq: Three times a day (TID) | INTRAMUSCULAR | Status: DC
Start: 2016-02-08 — End: 2016-02-09
  Administered 2016-02-09: 10:00:00 via INTRAVENOUS

## 2016-02-08 MED ORDER — MIDAZOLAM 1 MG/ML IJ SOLN
1 mg/mL | INTRAMUSCULAR | Status: AC
Start: 2016-02-08 — End: 2016-02-08
  Administered 2016-02-08: 18:00:00 via INTRAVENOUS

## 2016-02-08 MED FILL — PHENYLEPHRINE 10 MG/ML INJECTION: 10 mg/mL | INTRAMUSCULAR | Qty: 1

## 2016-02-08 MED FILL — METOPROLOL TARTRATE 25 MG TAB: 25 mg | ORAL | Qty: 1

## 2016-02-08 MED FILL — BD POSIFLUSH NORMAL SALINE 0.9 % INJECTION SYRINGE: INTRAMUSCULAR | Qty: 10

## 2016-02-08 MED FILL — MORPHINE 4 MG/ML SYRINGE: 4 mg/mL | INTRAMUSCULAR | Qty: 1

## 2016-02-08 MED FILL — HYDROCODONE-ACETAMINOPHEN 5 MG-325 MG TAB: 5-325 mg | ORAL | Qty: 1

## 2016-02-08 MED FILL — NITRO-BID 2 % TRANSDERMAL OINTMENT: 2 % | TRANSDERMAL | Qty: 1

## 2016-02-08 MED FILL — PROTONIX 40 MG INTRAVENOUS SOLUTION: 40 mg | INTRAVENOUS | Qty: 40

## 2016-02-08 MED FILL — EPTIFIBATIDE 2 MG/ML IV SOLN: 2 mg/mL | INTRAVENOUS | Qty: 20

## 2016-02-08 MED FILL — MIDAZOLAM 1 MG/ML IJ SOLN: 1 mg/mL | INTRAMUSCULAR | Qty: 2

## 2016-02-08 MED FILL — CYMBALTA 30 MG CAPSULE,DELAYED RELEASE: 30 mg | ORAL | Qty: 1

## 2016-02-08 MED FILL — NITROGLYCERIN 0.1 MG/ML (100 MCG/ML) D5W COMPOUNDED INJECTION: 0.1 mg/mL | INTRAVENOUS | Qty: 10

## 2016-02-08 MED FILL — VERAPAMIL 2.5 MG/ML IV: 2.5 mg/mL | INTRAVENOUS | Qty: 2

## 2016-02-08 MED FILL — LIDOCAINE HCL 1 % (10 MG/ML) IJ SOLN: 10 mg/mL (1 %) | INTRAMUSCULAR | Qty: 20

## 2016-02-08 MED FILL — GABAPENTIN 300 MG CAP: 300 mg | ORAL | Qty: 1

## 2016-02-08 MED FILL — MORPHINE 4 MG/ML SYRINGE: 4 mg/mL | INTRAMUSCULAR | Qty: 2

## 2016-02-08 MED FILL — SODIUM CHLORIDE 0.9 % IV: INTRAVENOUS | Qty: 1000

## 2016-02-08 MED FILL — BIVALIRUDIN 250 MG SOLUTION: 250 mg | INTRAVENOUS | Qty: 5

## 2016-02-08 MED FILL — METOPROLOL TARTRATE 5 MG/5 ML IV SOLN: 5 mg/ mL | INTRAVENOUS | Qty: 5

## 2016-02-08 MED FILL — FENTANYL CITRATE (PF) 50 MCG/ML IJ SOLN: 50 mcg/mL | INTRAMUSCULAR | Qty: 2

## 2016-02-08 MED FILL — SODIUM CHLORIDE 0.9 % INJECTION: INTRAMUSCULAR | Qty: 10

## 2016-02-08 MED FILL — SODIUM CHLORIDE 0.9 % IV PIGGY BACK: INTRAVENOUS | Qty: 50

## 2016-02-08 MED FILL — HEPARIN (PORCINE) 1,000 UNIT/ML IJ SOLN: 1000 unit/mL | INTRAMUSCULAR | Qty: 10

## 2016-02-08 MED FILL — DOCUSATE SODIUM 100 MG CAP: 100 mg | ORAL | Qty: 1

## 2016-02-08 MED FILL — BRILINTA 90 MG TABLET: 90 mg | ORAL | Qty: 2

## 2016-02-08 MED FILL — ISOVUE-250  51 % INTRAVENOUS SOLUTION: 250 mg iodine /mL (51 %) | INTRAVENOUS | Qty: 150

## 2016-02-08 MED FILL — HEPARIN (PORCINE) IN NS (PF) 1,000 UNIT/500 ML IV: 1000 unit/500 mL | INTRAVENOUS | Qty: 1000

## 2016-02-08 MED FILL — HEPARIN (PORCINE) IN NS (PF) 1,000 UNIT/500 ML IV: 1000 unit/500 mL | INTRAVENOUS | Qty: 500

## 2016-02-08 MED FILL — ASPIRIN 81 MG CHEWABLE TAB: 81 mg | ORAL | Qty: 1

## 2016-02-08 MED FILL — NITROGLYCERIN IN D5W 100 MG/250 ML (0.4 MG/ML) IV: 100 mg/250 mL (400 mcg/mL) | INTRAVENOUS | Qty: 250

## 2016-02-08 NOTE — Procedures (Signed)
[facility]    Name:  Lissa MerlinVANDIVER, Shane  MR#:  [id number]  DOB:  August 29, 1950  Account #:  [id number]  Date of Adm:  02/07/2016  Date of Service:      INDICATIONS FOR PROCEDURE: Non-ST elevation myocardial  infarction.    ESTIMATED BLOOD LOSS: Less than 100 mL.    SPECIMENS REMOVED: None.    PROCEDURES PERFORMED:  1. Left heart catheterization.  2. Selective coronary angiogram.  3. Successful revascularization of the mid to proximal LAD with 3 drug-  eluting stents, from distal to proximal is 2.25 x 16 mm Promus Premier  stent and in the mid, 2.75 x 18 mm Resolute stent and proximally, 3.0 x 22 mm Resolute stent with excellent result.    COUNSELING: Risks, benefits and alternatives were discussed in  detail with the patient and family. They all agreed to proceed for the  above procedure.    PROCEDURE AND TECHNIQUE: The patient was brought to the  cardiac catheterization lab in a fasting and nonsedated state. The  patient was prepped and draped in the usual sterilized fashion. The  right radial area was anesthetized with local anesthetic. A 6-French  sheath was placed under fluoroscopic guidance without any  complication and a 5-French JR4 catheter was used to perform the left  heart catheterization. LVEDP was 23 mmHg without any gradient from  LV to aorta and the same catheter was attempted to engage the right  coronary artery that was unsuccessful. Then, a 3DRC and then AR  MOD and then 5-French JR4 catheters were used to perform the  selective angiography of the right coronary artery. After completing the  right coronary angiography, then JL3.5 diagnostic catheter was used to  perform the selective angiography of the left coronary system. The  patient remained hemodynamically stable. After completing the  diagnostic angiography, then the patient was given Angiomax for the  procedure. A 6-French EBU 3.0 guide catheter was used to perform  the PCI of the LAD. The LAD  was engaged with the 6-French guide  after giving the Angiomax and Whisper MS wire was crossed distal to  the lesion. A 2.0,compliant then a 2.5 noncompliant balloon was used to do the  predilatation of the lesion without any difficulty and without any complication.Then a 2.5 x 38 mm Xience Alpine stent  was attempted to cross the lesion, that was unsuccessful. Then, 2.5 x  28 mm Xience Alpine stent was also attempted which was unsuccessfulThe patient remained  hemodynamically stable during the balloon angioplasty, but while  attempting crossing the stent, then the patient started having  significant chest pain and EKG changes also in the form of ST and T depression changes without any ST elevation. The patient was started on nitro drip. The patient was also given morphine. Repeat angioplasty was performed with 2.75 mm and then 3.0 mm non compliant balloon without any complication. Angiographically, patient mainatined TIMI flow throughout procedure.Prowater wire was crossed as a buddy wire to the distal LAD and then a 2.25 x 16 mm  Promus Premier stent was deployed with excellent results, then a Whisper MS wire was pulled out and then a 2.75 x 18 mm Resolute stent was deployed and then a 3.0 x 22 mm Resolute stent was deployed in the proximal area  with overlapping fashion. Post-stent dilatation was performed with a 3.0 noncompliant balloon in the mid area and 3.75 in the proximal area. The patient remained hemodynamically stable after  that and the chest pain and EKG stabilized and a final angiogram has shown no dissection or hematoma with TIMI-3 excellent flow. The proximal 90%  stenosis reduced to almost 0%. Mid LAD 95% stenosis reduced to 0%.    The final angiogram was excellent. The patient was given Brilinta and  2 boluses of Integrilin during the procedure.    FINDINGS:  1. This is a right dominant coronary anatomy. LVEDP is 23 mmHg.  2. Left main artery is a short vessel that trifurcates into LAD and  ramus  intermedius large branch and small left circumflex artery. The left main  is mildly angulated but patent small caliber vessel may have less than  10% proximal disease.  3. LAD ostium is patent. The proximal LAD had 90% long segment of  disease and then mid LAD also had 95% segment of severe. Disease  distal LAD had 30% disease, mild disease in the small diagonal  branch in small D1 and mild disease in the D2 branch.  The LAD is reaching to the apex and the apex bifurcated into right  and left small branches which are also patent.  4. Ramus intermedius is a large branch, patent in its ostium, proximal  mid and distal area, gives rise to small distally 3 branches which are  also patent. There is a high small branch coming from the ramus  intermedius which also has minimal disease.  6. Left circumflex artery is a probably 2 mm vessel minimal disease in  the proximal area, otherwise patent. Mid and distal left circumflex  artery are small branches.  7. Right coronary artery is a moderate size vessel, patent in its ostium,  proximal mid area. Distally, the RCA has mild disease, 30% to 40%  disease, otherwise patent. The PLV branch is a moderate-sized vessel,  patent without any critical disease and PDA bifurcates very early into 2  branches, otherwise patent.    INTERVENTION: Successful revascularization of the mid to proximal  LAD with 3 overlapping stents, 2.25 x 16 mm Promus Premier stent  and then mid stent was 2.75 x 18 mm Resolute stent, and the proximal  stent was a 3.0 x 22 mm stent with excellent result. Post-stent  dilatation was performed with a 3.0 and 3.75 noncompliant balloon.  The patient had mild disease in the distal RCA, mild disease in the  distal left anterior descending system.    RECOMMENDATIONS: Continue aspirin lifelong, Brilinta for 2 years.  Continue risk factor management. Treatment plan was discussed with  the patient and family.        Asa SaunasMASOOD Nahsir Venezia, MD    MA / MS1  D:  02/08/2016    16:24  T:  02/08/2016   21:49  Job #:  [id number]

## 2016-02-08 NOTE — Other (Signed)
TRANSFER - OUT REPORT:    Verbal report given to Fridah Mbatia ,Rn(name) on Shane Mann  being transferred to Care Unit(unit) for ordered procedure       Report consisted of patient???s Situation, Background, Assessment and   Recommendations(SBAR).     Information from the following report(s) SBAR was reviewed with the receiving nurse.    Lines:   Peripheral IV 02/07/16 Left Arm (Active)   Site Assessment Clean, dry, & intact 02/08/2016 12:00 AM   Phlebitis Assessment 0 02/08/2016 12:00 AM   Infiltration Assessment 0 02/08/2016 12:00 AM   Dressing Status Clean, dry, & intact 02/08/2016 12:00 AM   Dressing Type Transparent;Tape 02/08/2016 12:00 AM   Hub Color/Line Status Pink;Infusing 02/08/2016 12:00 AM   Alcohol Cap Used Yes 02/08/2016 12:00 AM        Opportunity for questions and clarification was provided.      Patient transported with:   Monitor  O2 @ 2 liters  Tech

## 2016-02-08 NOTE — Other (Signed)
EMR entered and reviewed by Professional Development Specialist for the purpose of chart review in the course of performing educational functions and responsibilities related to performance improvement.

## 2016-02-08 NOTE — Progress Notes (Addendum)
2000 Assessment completed. See flowsheet. VSS. Denies pain, discomfort or sob at this time. Right wrist approach with TR band in place, gauze dressing CDI with tegaderm. Wrist guard in place. Pulses present and palpable. Sensation to hand present. Extremity warm to touch with appropriate cap refill. BLE pulses present and palpable. Sensation intact. Wife at bedside. Denies any current needs. Will continue to closely  Monitor.     2030 Pt standing beside bed attempting to use urinal, had taken off wrist guard. Assisted patient with urinal. Education given on wrist guard and importance of not bending wrist. Dressing remains intact. Pulses present and palpable in procedure site extremity. Sensation present. Cap refill remains unchanged. Pt voices understanding of keeping site straight and not bending wrist site. Will continue to monitor.     2115 Pt medicated with prn Morphine for complaints of headache. See MAR. Wife states this is a chronic issue and pt has seen doctors about prolonged HA.     2200 Resting eyes closed. VSS. RUE procedure site unchanged. Will continue to monitor.    0000 Reassessment completed. See flowsheet. VSS. RUE procedure site unchanged. Pulses, sensation, and cap refill unchanged. Resting peacefully. NAD.    0200 No change.    0300 Reassessment completed. See flowsheet. VSS. Wife at bedside. Order obtained from Dr. Romilda GarretSoni for post cath EKG. Pt continues to deny chest pain, SOB. EKG for morning so patient can rest per doctor.    0600 NAD. VSS.     0725 Bedside and Verbal shift change report given to Marcy Salvoaymond, RN (oncoming nurse) by Cyd SilenceMeghan L Roxy Filler, RN   (offgoing nurse). Report included the following information SBAR, Kardex and MAR.

## 2016-02-08 NOTE — Progress Notes (Signed)
Daily Progress Note: 02/08/2016 1:46 PM   Admit Date: 02/07/2016    Patient seen in follow up for multiple medical problems as listed below:  Patient Active Problem List   Diagnosis Code   ??? Chest pain R07.9   ??? NSTEMI (non-ST elevated myocardial infarction) (HCC) I21.4   ??? OSA (obstructive sleep apnea) G47.33   ??? GBS (Guillain Barre syndrome) (HCC) G61.0   ??? GERD (gastroesophageal reflux disease) K21.9       Assesment     NSTEMI with exertional angina  Tn=1, with active chest pain and EKG changes  Serial Tn stable Tn at 1.4  Heparin gtt  ASA, BB, Statin  Cardiac Cath 6/21 Dr Tasia CatchingsAhmed  TTE x1  ??  OSA-CPAP  Gout - hold allopurinol, colcrys  ED - on PDE5s none recent  GERD - PPI  Hx of GBS with neuropathy - resume neurontin+cymbalta    DVT Protocol Active: heparin  Code Status:  Full Code     Disposition: home 1-2 days    Subjective:     CC: Chest Pain    Interval History: Tn stayed elevated to 1.4, he has a cough which appears to be an anginal equivalent, possibly from diaphragmatic irritation which improved with oxygen. No active chest pain nor pressure.     ROS: 11 point ROS negative except for cough.    Objective:     Visit Vitals   ??? BP 119/73   ??? Pulse 75   ??? Temp 97.6 ??F (36.4 ??C)   ??? Resp 20   ??? Ht 5\' 11"  (1.803 m)   ??? Wt 107.5 kg (237 lb)   ??? SpO2 94%   ??? BMI 33.05 kg/m2       Temp (24hrs), Avg:98 ??F (36.7 ??C), Min:97.6 ??F (36.4 ??C), Max:98.5 ??F (36.9 ??C)        Intake/Output Summary (Last 24 hours) at 02/08/16 1346  Last data filed at 02/08/16 1131   Gross per 24 hour   Intake           127.33 ml   Output              200 ml   Net           -72.67 ml       Gen: AOx3, NAD, NC on  HEENT:  PERL, EOMI.   Neck: No Bruits/JVD   Lungs:   CTAB. Good respiratory effort  Heart:   RR S1 S2 without M/R/G  Abdomen: ND,NT, BSX4,   Extremities:   No LE edema. No cyanosis.  Skin:  no jaundice/lesions      Data Review:     Meds/Labs/Tests reviewed    Current Shift:  06/21 0701 - 06/21 1900  In: 68.2 [I.V.:68.2]  Out: -    Last three shifts:  06/19 1901 - 06/21 0700  In: 59.1 [I.V.:59.1]  Out: 200 [Urine:200]  Recent Labs      02/08/16   0655  02/07/16   2105  02/07/16   1615   WBC  9.3  9.7  7.6   RBC  5.06  5.37  5.33   HGB  16.1*  17.4*  17.1*   HCT  45.6  48.3*  47.4   PLT  185  216  200   GRANS   --    --   57   LYMPH   --    --   31   EOS   --    --  3       Recent Labs      02/08/16   0655  02/07/16   1615   BUN  18  17   CREA  1.15  1.21   CA  8.8  8.9   ALB   --   3.7   K  3.7  3.9   NA  142  142   CL  105  106   CO2  28  26   GLU  120*  120*        Lab Results   Component Value Date/Time    Glucose 120 02/08/2016 06:55 AM    Glucose 120 02/07/2016 04:15 PM          Care coordination with Nursing/Consultants/staff: 10  Prior history, labs, and charting reviewed: 15    Procedures/Imaging:  TTE 6/21  Cardiac Cath 6/21    Total time spent with chart review, patient examination/education, discussion with staff on case,documentation and medication management / adjustment  :  30 Minutes      Dr Malka So DO  [facility]  Hospitalist Division  Pager: [phone]

## 2016-02-08 NOTE — Progress Notes (Signed)
Ms 4mg  given iv for head pain, ms delivered at 4mg  per 1cc,  3mg  or 3/4 cc wasted in presence of fredah Mbatia RN

## 2016-02-08 NOTE — Other (Signed)
Echocardiogram completed. Report to follow.

## 2016-02-08 NOTE — Other (Signed)
Bedside and Verbal shift change report given to E. Morrison RN (oncoming nurse) by M. Norman RN (offgoing nurse). Report included the following information SBAR, Kardex, Intake/Output, MAR, Recent Results and Cardiac Rhythm SR.

## 2016-02-08 NOTE — Progress Notes (Signed)
Stent cards, stent information booklets, brilinta information booklet,cardiac rehab RX, and biography card for Dr Tasia CatchingsAhmed given to wife in packet, all information packets reviewed with patient's wife, pt wife verbalizes understanding.  Pt wife encouraged to review information and document any questions concerning pts condition or course of treatment.

## 2016-02-08 NOTE — Progress Notes (Signed)
D/c plan. Chart reviewed. Patient remains in Cath lab at this time.    Readmission Risk Assessment: Low Risk and MSSP/Good Help ACO patients    RRAT Score: 1 - 12    Initial Assessment: 65 y.o. male who presents to the emergency department C/O recurrent 3 minute episodes of left sided chest pain starting 5 weeks ago which have been increasingly more frequent and more severe. Pt's last episode of chest pain was this morning and lasted 45 minutes patient admitted for a no- STEMI    Emergency Contact: Spouse  Purvis KiltsDianne Veltre [phone]    Pertinent Medical Hx:  Gout, Genella RifeGerd, Sleep apnea, Alcide CleverGullian Barre Syndrome    PCP/Specialists:     Community Services:     DME:     Low Risk Care Transition Plan:  1. [facility], community care coordination of resources  2. Involve patient/caregiver in assessment, planning, education and implement of intervention.  3. CM daily patient care huddles/interdisciplinary rounds.  4. PCP/Specialist appointment within 7 - 10 days made prior to discharge.  5. Facilitate transportation and logistics for follow-up appointments.  6. [facility] Nurse Navigator or PCP practice    Care Management Interventions  PCP Verified by CM: Yes  Transition of Care Consult (CM Consult): Discharge Planning  Current Support Network: Lives with Spouse     Care management will continue to be available and assist with a safe transition of care.

## 2016-02-08 NOTE — Progress Notes (Addendum)
1950: Pt received to room, accompanied by wife and daughter.  Pt has no complaints of chest pain at this time, only a headache which pt received Tylenol #3 for in ED.  Pt changed out of ED gown.  Tele monitor applied.  Socks changed.  Pt comfortable.  Pt understands he is going for a cardiac catheterization tomorrow and that he is to be NPO after midnight.      2100:  Lab called with trop of 1.4.  Dr. Tasia CatchingsAhmed made aware.     Shift summary:  Pt remained stable.  No acute changes. No complaints of chest pain.  Wife to spend the night.  Pt NPO after midnight.    Alarm parameters reviewed, on and audible Appropriate for patient clinical condition

## 2016-02-08 NOTE — Other (Signed)
Cardiac Cath Lab:  Pre Procedure Chart Check    Patients chart was accessed and reviewed for possible and/or scheduled procedure.      Creatinine Clearance:  CREATININE: 1.15 MG/DL (16/05/9605/21/17 [id number])  Estimated creatinine clearance: 81 mL/min    Total Contrast  Load:  3 x estimated clearance amount=    243   ml    75% of Contrast Load:  0.75 x Total Contrast Load=     182.25   ml    Recent Labs      02/08/16   0655  02/08/16   0120   02/07/16   1615   WBC  9.3   --    < >  7.6   RBC  5.06   --    < >  5.33   HCT  45.6   --    < >  47.4   HGB  16.1*   --    < >  17.1*   PLT  185   --    < >  200   APTT   --   48.4*   --   26.5   NA  142   --    --   142   K  3.7   --    --   3.9   BUN  18   --    --   17   CREA  1.15   --    --   1.21   GFRAA  >60   --    --   >60   GFRNA  >60   --    --   >60   CA  8.8   --    --   8.9   CPK   --    --    --   269   CKMB   --    --    --   8.6*   CKND1   --    --    --   3.2   TROIQ  1.40*   --    < >  1.04*    < > = values in this interval not displayed.       BMI: Body mass index is 33.05 kg/(m^2).    ALLERGIES: No Known Allergies    Lines:        Peripheral IV 02/07/16 Left Arm (Active)   Site Assessment Clean, dry, & intact 02/08/2016 12:00 AM   Phlebitis Assessment 0 02/08/2016 12:00 AM   Infiltration Assessment 0 02/08/2016 12:00 AM   Dressing Status Clean, dry, & intact 02/08/2016 12:00 AM   Dressing Type Transparent;Tape 02/08/2016 12:00 AM   Hub Color/Line Status Pink;Infusing 02/08/2016 12:00 AM   Alcohol Cap Used Yes 02/08/2016 12:00 AM          History:    Past Medical History:   Diagnosis Date   ??? GERD (gastroesophageal reflux disease)    ??? Gout    ??? Guillain Barr?? syndrome (HCC)    ??? Sleep apnea      Past Surgical History:   Procedure Laterality Date   ??? HX CATARACT REMOVAL       Patient Active Problem List   Diagnosis Code   ??? Chest pain R07.9   ??? NSTEMI (non-ST elevated myocardial infarction) (HCC) I21.4   ??? OSA (obstructive sleep apnea) G47.33    ??? GBS (Guillain Barre syndrome) (HCC) G61.0   ??? GERD (gastroesophageal reflux  disease) K21.9

## 2016-02-08 NOTE — Progress Notes (Signed)
1820 Received report from Judi SaaLiz Edwards RN     [phone] pt arrived to ICU and placed into bed #3 Pt on all ICU monitoring equipment. vss NSR HR 88. He is oriented to room and call bell. He denies pain discomfort or sob at this time. Rt radial approach with TR band in place, dressing cd&i. Wrist guard in place. 2x2 gauze with tegaderm in place. Site CD&I. Pulses palpable in radial and bil pedal pulses. Fingers warm to touch and good cap refill, see PVFS. Pt was assisted to stand and use the urinal. Voided about 300 dark yellow urine. Family now brought to bedside.     1915 Report to Loma SousaMeghan Pollard RN Bedside and Verbal shift change report given to Loma SousaMeghan Pollard RN (oncoming nurse) by Baltazar NajjarJennifer M Sorrel Cassetta, RN   (offgoing nurse). Report included the following information SBAR, Kardex, Intake/Output, MAR, Recent Results, Med Rec Status, Cardiac Rhythm NSR and Alarm Parameters     2002 [employer] called to check out home cpap. Spoke with Berna SpareMarcus at [employer]. Work order #[id number]. RT Ernie aware and Wylene Simmerollard will request order from Hospitalist.

## 2016-02-08 NOTE — Other (Signed)
TRANSFER - OUT REPORT:  Verbal report given to Vicente MalesJennifer Jefferson, RN on Jacqualine Codeonald W Houchen being transferred to icu for routine post - op   Report consisted of patient???s Situation, Background, Assessment and   Recommendations(SBAR).   Information from the following report(s) SBAR, Procedure Summary, MAR and Cardiac Rhythm NSR was reviewed with the receiving nurse.    Cath Lab Report:    Procedure:  Arly.Keller[X ] LHC  [ ]  RHC  [ ]  PTCA   [ ]  Peripheral   [ ]  Pacemaker [ ]  TEE  [ ]  DCC    Access site:   Arly.Keller[X ] Radial     [ ]  Brachial     [ ]  Femoral    [ ]  Jugular   [ ]  Chest Wall       Sheath:           Arly.Keller[X ] Pulled in Cath Lab   [ ]  In place   [ ]  To be pulled after:         Closure:          Arly.Keller[X ] TR Band [ ]  Radial Band     [ ]  Right [ ]  Left    [ ]  Manual Pressure     [ ]  Angio Seal     [ ]  Star Close    [ ]  Per Close    [ ]  Safe Guard    Site Assessment:   Arly.Keller[X ] Clean, Dry, No bleeding    [ ]  Minor oozing          [ ]  Hematoma: Description:    Stents(s) Placement:  [ ]  Left Main:                 Arly.Keller[X ] LAD: mid to proximal x's3                [ ]  Circ:                [ ]  RCA:                [ ]  EF:     [ ]  Peripheral:      [ ]  N/A    Infusion [X ]Angiomax (completed) [ ]  Integrelin [ ]  Heparin d/c'd:     Intra procedure Medications:    Fentanyl: 150 mcg   Versed: 3 mg   Other: Brilinta 180 mg  Antiplatelet:    Lines:        Peripheral IV 02/07/16 Left Arm (Active)   Site Assessment Clean, dry, & intact 02/08/2016 10:40 AM   Phlebitis Assessment 0 02/08/2016 10:40 AM   Infiltration Assessment 0 02/08/2016 10:40 AM   Dressing Status Clean, dry, & intact 02/08/2016 10:40 AM   Dressing Type Transparent;Tape 02/08/2016 10:40 AM   Hub Color/Line Status Pink 02/08/2016 10:40 AM   Alcohol Cap Used Yes 02/08/2016 10:40 AM          Patient Vitals for the past 4 hrs:   Temp Pulse Resp BP SpO2   02/08/16 1820 97.7 ??F (36.5 ??C) 78 16 149/81 96 %   02/08/16 1609 - 90 16 132/74 94 %         Extended / Orthostatic Vitals:    Vital Signs   Level of Consciousness: Alert (02/08/16 1820)  Temp: 97.7 ??F (36.5 ??C) (02/08/16 1820)  Temp Source: Oral (02/08/16 1820)  Pulse (Heart Rate): 78 (02/08/16 1820)  Heart Rate Source: Monitor (02/08/16 1820)  Cardiac Rhythm: Normal sinus rhythm (02/08/16 1820)  Resp Rate: 16 (02/08/16 1820)  BP: 149/81 (02/08/16 1820)  MAP (Monitor): 84 (02/08/16 1300)  MAP (Calculated): 104 (02/08/16 1820)  BP 1 Location: Left arm (02/08/16 1130)  BP 1 Method: Automatic (02/08/16 1130)  BP Patient Position: At rest (02/08/16 1130)  MEWS Score: 1 (02/08/16 1820)         Oxygen Therapy  O2 Sat (%): 96 % (02/08/16 1820)  Pulse via Oximetry: 74 beats per minute (02/08/16 1300)  O2 Device: Nasal cannula (02/08/16 1820)  O2 Flow Rate (L/min): 2 l/min (02/08/16 1820)          Opportunity for questions and clarification was provided.

## 2016-02-08 NOTE — Progress Notes (Signed)
To cath lab via stretcher for procedure

## 2016-02-08 NOTE — Progress Notes (Addendum)
0730 Assumed care of patient from off-going nurse Joyce GrossE. Morrison.     1010 Patient left unit to Care Unit for Cardiac Cath. Heparin gtt infusing at 14 unit/kg/hr. PTT unable to be obtained at this time. Morning medications administered.

## 2016-02-08 NOTE — Other (Signed)
TRANSFER - OUT REPORT:  Verbal report given to  on Jacqualine CodeRonald W Thome being transferred to ICU for routine post - op   Report consisted of patient???s Situation, Background, Assessment and   Recommendations(SBAR).   Information from the following report(s) SBAR was reviewed with the receiving nurse.    Cath Lab Report:    Procedure:  Arly.Keller[X ] LHC  [ ]  RHC  [ X] PTCA   [ ]  Peripheral   [ ]  Pacemaker [ ]  [facility]E  [ ]  [facility]    Access site:   Arly.Keller[X ] Radial     [ ]  Brachial     [ ]  Femoral    [ ]  Jugular   [ ]  Chest Wall       Sheath:           [ ]  Pulled in Cath Lab   [ ]  In place   [ ]  To be pulled after:         Closure:          [ ]  TR Band [ X] Radial Band     [ ]  Right [ ]  Left    [ ]  Manual Pressure     [ ]  Angio Seal     [ ]  Star Close    [ ]  Per Close    [ ]  Safe Guard    Site Assessment:   [ X] Clean, Dry, No bleeding    [ ]  Minor oozing          [ ]  Hematoma: Description:    Stents(s) Placement:  [ ]  Left Main:                 [ X] LAD:                [ ]  Circ:                [ ]  RCA:                [ ]  EF:     [ ]  Peripheral:      [ ]  N/A    Infusion [X ]Angiomax [ ]  Integrelin [ ]  Heparin d/c'd:     Intra procedure Medications:    Fentanyl: 150 mcg  Versed: 3mg   Other: morphine 4 mg  Antiplatelet: Angiomax and Integrilin    Lines:        Peripheral IV 02/07/16 Left Arm (Active)   Site Assessment Clean, dry, & intact 02/08/2016 10:40 AM   Phlebitis Assessment 0 02/08/2016 10:40 AM   Infiltration Assessment 0 02/08/2016 10:40 AM   Dressing Status Clean, dry, & intact 02/08/2016 10:40 AM   Dressing Type Transparent;Tape 02/08/2016 10:40 AM   Hub Color/Line Status Pink 02/08/2016 10:40 AM   Alcohol Cap Used Yes 02/08/2016 10:40 AM          Patient Vitals for the past 4 hrs:   Pulse Resp BP SpO2   02/08/16 1609 90 16 132/74 94 %   02/08/16 1300 75 20 119/73 94 %         Extended / Orthostatic Vitals:    Vital Signs  Level of Consciousness: Alert (02/08/16 1609)  Temp: 97.6 ??F (36.4 ??C) (02/08/16 1033)   Temp Source: Oral (02/08/16 1033)  Pulse (Heart Rate): 90 (02/08/16 1609)  Heart Rate Source: Monitor (02/08/16 1609)  Cardiac Rhythm: Normal sinus rhythm (02/08/16 1609)  Resp Rate: 16 (02/08/16 1609)  BP: 132/74 (02/08/16 1609)  MAP (  Monitor): 84 (02/08/16 1300)  MAP (Calculated): 93 (02/08/16 1609)  BP 1 Location: Left arm (02/08/16 1130)  BP 1 Method: Automatic (02/08/16 1130)  BP Patient Position: At rest (02/08/16 1130)  MEWS Score: 1 (02/08/16 1033)         Oxygen Therapy  O2 Sat (%): 94 % (02/08/16 1609)  Pulse via Oximetry: 74 beats per minute (02/08/16 1300)  O2 Device: Nasal cannula (02/08/16 1609)  O2 Flow Rate (L/min): 2 l/min (02/08/16 0830)          Opportunity for questions and clarification was provided.

## 2016-02-08 NOTE — Progress Notes (Signed)
Pt arrived to care unit via bed with family, hospitalist into see pt

## 2016-02-08 NOTE — Procedures (Signed)
[facility]    Name:  Shane MerlinVANDIVER, Dominico  MR#:  [id number]  DOB:  [birth year]  Account #:  [id number]  Date of Adm:  02/07/2016  Date of Service:      INDICATIONS FOR PROCEDURE: Non-ST elevation myocardial  infarction.    ESTIMATED BLOOD LOSS: Less than 100 mL.    SPECIMENS REMOVED: None.    PROCEDURES PERFORMED:  1. Left heart catheterization.  2. Selective coronary angiogram.  3. Successful revascularization of the mid to proximal LAD with 3 drug-  eluting stents, from distal to proximal is 2.25 x 16 mm Promus Premier  stent and in the mid, 2.75 x 18 mm Resolute stent and proximally, 3.0 x 22 mm Resolute stent with excellent result.    COUNSELING: Risks, benefits and alternatives were discussed in  detail with the patient and family. They all agreed to proceed for the  above procedure.    PROCEDURE AND TECHNIQUE: The patient was brought to the  cardiac catheterization lab in a fasting and nonsedated state. The  patient was prepped and draped in the usual sterilized fashion. The  right radial area was anesthetized with local anesthetic. A 6-French  sheath was placed under fluoroscopic guidance without any  complication and a 5-French JR4 catheter was used to perform the left  heart catheterization. LVEDP was 23 mmHg without any gradient from  LV to aorta and the same catheter was attempted to engage the right  coronary artery that was unsuccessful. Then, a 3DRC and then AR  MOD and then 5-French JR4 catheters were used to perform the  selective angiography of the right coronary artery. After completing the  right coronary angiography, then JL3.5 diagnostic catheter was used to  perform the selective angiography of the left coronary system. The  patient remained hemodynamically stable. After completing the  diagnostic angiography, then the patient was given Angiomax for the  procedure. A 6-French EBU 3.0 guide catheter was used to perform   the PCI of the LAD. The LAD was engaged with the 6-French guide  after giving the Angiomax and Whisper MS wire was crossed distal to  the lesion. A 2.0,compliant then a 2.5 noncompliant balloon was used to do the  predilatation of the lesion without any difficulty and without any complication.Then a 2.5 x 38 mm Xience Alpine stent  was attempted to cross the lesion, that was unsuccessful. Then, 2.5 x  28 mm Xience Alpine stent was also attempted which was unsuccessfulThe patient remained  hemodynamically stable during the balloon angioplasty, but while  attempting crossing the stent, then the patient started having  significant chest pain and EKG changes also in the form of ST and T depression changes without any ST elevation. The patient was started on nitro drip. The patient was also given morphine. Repeat angioplasty was performed with 2.75 mm and then 3.0 mm non compliant balloon without any complication. Angiographically, patient mainatined TIMI flow throughout procedure.Prowater wire was crossed as a buddy wire to the distal LAD and then a 2.25 x 16 mm  Promus Premier stent was deployed with excellent results, then a Whisper MS wire was pulled out and then a 2.75 x 18 mm Resolute stent was deployed and then a 3.0 x 22 mm Resolute stent was deployed in the proximal area  with overlapping fashion. Post-stent dilatation was performed with a 3.0 noncompliant balloon in the mid area and 3.75 in the proximal area. The patient remained hemodynamically stable after  that and the chest pain and EKG stabilized and a final angiogram has shown no dissection or hematoma with TIMI-3 excellent flow. The proximal 90%  stenosis reduced to almost 0%. Mid LAD 95% stenosis reduced to 0%.    The final angiogram was excellent. The patient was given Brilinta and  2 boluses of Integrilin during the procedure.    FINDINGS:  1. This is a right dominant coronary anatomy. LVEDP is 23 mmHg.   2. Left main artery is a short vessel that trifurcates into LAD and ramus  intermedius large branch and small left circumflex artery. The left main  is mildly angulated but patent small caliber vessel may have less than  10% proximal disease.  3. LAD ostium is patent. The proximal LAD had 90% long segment of  disease and then mid LAD also had 95% segment of severe. Disease  distal LAD had 30% disease, mild disease in the small diagonal  branch in small D1 and mild disease in the D2 branch.  The LAD is reaching to the apex and the apex bifurcated into right  and left small branches which are also patent.  4. Ramus intermedius is a large branch, patent in its ostium, proximal  mid and distal area, gives rise to small distally 3 branches which are  also patent. There is a high small branch coming from the ramus  intermedius which also has minimal disease.  6. Left circumflex artery is a probably 2 mm vessel minimal disease in  the proximal area, otherwise patent. Mid and distal left circumflex  artery are small branches.  7. Right coronary artery is a moderate size vessel, patent in its ostium,  proximal mid area. Distally, the RCA has mild disease, 30% to 40%  disease, otherwise patent. The PLV branch is a moderate-sized vessel,  patent without any critical disease and PDA bifurcates very early into 2  branches, otherwise patent.    INTERVENTION: Successful revascularization of the mid to proximal  LAD with 3 overlapping stents, 2.25 x 16 mm Promus Premier stent  and then mid stent was 2.75 x 18 mm Resolute stent, and the proximal  stent was a 3.0 x 22 mm stent with excellent result. Post-stent  dilatation was performed with a 3.0 and 3.75 noncompliant balloon.  The patient had mild disease in the distal RCA, mild disease in the  distal left anterior descending system.    RECOMMENDATIONS: Continue aspirin lifelong, Brilinta for 2 years.  Continue risk factor management. Treatment plan was discussed with   the patient and family.        Asa SaunasMASOOD Lotus Santillo, MD    MA / MS1  D:  02/08/2016   16:24  T:  02/08/2016   21:49  Job #:  [id number]

## 2016-02-08 NOTE — Progress Notes (Signed)
0.75 cc IV morphine wasted with Ola Spurrerrie N., RN.

## 2016-02-08 NOTE — Progress Notes (Signed)
Cardiology Progress Note        Patient: Shane Mann        Sex: male          DOA: 02/07/2016  Date of Birth:  03/30/1951      Age:  65 y.o.        LOS:  LOS: 1 day   Assessment/Plan     Principal Problem:    NSTEMI (non-ST elevated myocardial infarction) (HCC) (02/07/2016)    Active Problems:    Chest pain (02/07/2016)      OSA (obstructive sleep apnea) (02/07/2016)      GBS (Guillain Barre syndrome) (HCC) (02/07/2016)      GERD (gastroesophageal reflux disease) (02/07/2016)        Plan:  successful PCI to mid to proximal LAD X 3 stents.  Monitor in ICU  Continue with Brilinta, aspirin and statin  Discussed with patient and family                    Subjective:    cc:  comfortable      REVIEW OF SYSTEMS: NO CP, SOB, N,V,D, F,C  Objective:      Visit Vitals   ??? BP 132/74   ??? Pulse 90   ??? Temp 97.6 ??F (36.4 ??C)   ??? Resp 16   ??? Ht 5\' 11"  (1.803 m)   ??? Wt 107.5 kg (237 lb)   ??? SpO2 94%   ??? BMI 33.05 kg/m2     Body mass index is 33.05 kg/(m^2).    Physical Exam:  General Appearance: Comfortable, not using accessory muscles of respiration.  HEENT: PERLA.   HEAD: Atraumatic  NECK: No JVD, no thyroidomeglay. CAROTIDS:  LUNGS: Clear bilaterally.   HEART: S1+S2 audible, no murmur, no pericardial rub.     ABD: Non-tender, BS Audible    EXT: No edema, and no cysnosis.  VASCULAR EXAM: Pulses are intact.    PSYCHIATRIC EXAM: Mood is appropriate.    Medication:  Current Facility-Administered Medications   Medication Dose Route Frequency   ??? 0.9% sodium chloride infusion  25 mL/hr IntraVENous CONTINUOUS   ??? 0.9% sodium chloride infusion  25 mL/hr IntraVENous CONTINUOUS   ??? bivalirudin (ANGIOMAX) 250 mg in 0.9% sodium chloride (MBP/ADV) 50 mL infusion  1.75 mg/kg/hr IntraVENous CONTINUOUS   ??? fentaNYL citrate (PF) injection 25-100 mcg  25-100 mcg IntraVENous Multiple   ??? heparin (porcine) 1,000 unit/mL injection 1,000-4,000 Units  1,000-4,000 Units IntraVENous Multiple    ??? heparinized saline 2 units/mL infusion 1,000 Units  500 mL IntraarTERial Multiple   ??? iopamidol (ISOVUE 250) 51 % contrast injection 101-150 mL  101-150 mL IntraCATHeter Multiple   ??? lidocaine (XYLOCAINE) 10 mg/mL (1 %) injection 3-30 mL  3-30 mL IntraDERMal Multiple   ??? midazolam (VERSED) injection 0.5-2 mg  0.5-2 mg IntraVENous Multiple   ??? nitroglycerin 100 mcg/ml compounded injection  1-2 mL IntraCORONary Multiple   ??? nitroglycerin 1 mg/910mL 0.2 mg, verapamil 2.5 mg injection  3 mL IntraarTERial Multiple   ??? verapamil (ISOPTIN) 2.5 mg/mL injection       ??? 0.9% sodium chloride (MBP/ADV) 0.9 % infusion       ??? PHENYLephrine (NEOSYNEPHRINE) injection 100 mcg  100 mcg IntraVENous Multiple   ??? bivalirudin (ANGIOMAX) 250 mg injection       ??? 0.9% sodium chloride (MBP/ADV) 0.9 % infusion       ??? morphine injection 1-4 mg  1-4 mg IntraVENous Multiple   ???  nitroglycerin (TRIDIL) 400 mcg/ml infusion  5-20 mcg/min IntraVENous TITRATE   ??? metoprolol (LOPRESSOR) injection 5 mg  5 mg IntraVENous Multiple   ??? sodium chloride 0.9 % injection       ??? pantoprazole (PROTONIX) 40 mg injection       ??? eptifibatide (INTEGRILIN) injection 19.36 mg  180 mcg/kg IntraVENous Multiple   ??? bivalirudin (ANGIOMAX) 250 mg injection       ??? 0.9% sodium chloride (MBP/ADV) 0.9 % infusion       ??? nitroglycerin (NITROSTAT) tablet 0.4 mg  0.4 mg SubLINGual PRN   ??? nitroglycerin (NITROBID) 2 % ointment 1 Inch  1 Inch Topical BID   ??? DULoxetine (CYMBALTA) capsule 30 mg  30 mg Oral BID   ??? gabapentin (NEURONTIN) capsule 300 mg  300 mg Oral QID   ??? sodium chloride (NS) flush 5-10 mL  5-10 mL IntraVENous Q8H   ??? sodium chloride (NS) flush 5-10 mL  5-10 mL IntraVENous PRN   ??? magnesium hydroxide (MILK OF MAGNESIA) 400 mg/5 mL oral suspension 30 mL  30 mL Oral DAILY PRN   ??? docusate sodium (COLACE) capsule 100 mg  100 mg Oral BID   ??? HYDROcodone-acetaminophen (NORCO) 5-325 mg per tablet 1 Tab  1 Tab Oral Q4H PRN    ??? morphine injection 5 mg  5 mg IntraVENous Q4H PRN   ??? ondansetron (ZOFRAN ODT) tablet 4 mg  4 mg Oral Q6H PRN   ??? atorvastatin (LIPITOR) tablet 40 mg  40 mg Oral QHS   ??? metoprolol tartrate (LOPRESSOR) tablet 12.5 mg  12.5 mg Oral Q12H   ??? aspirin chewable tablet 81 mg  81 mg Oral DAILY               Lab/Data Reviewed:  All lab results for the last 24 hours reviewed.     Recent Labs      02/08/16   0655  02/07/16   2105  02/07/16   1615   WBC  9.3  9.7  7.6   HGB  16.1*  17.4*  17.1*   HCT  45.6  48.3*  47.4   PLT  185  216  200     Recent Labs      02/08/16   0655  02/07/16   1615   NA  142  142   K  3.7  3.9   CL  105  106   CO2  28  26   GLU  120*  120*   BUN  18  17   CREA  1.15  1.21   CA  8.8  8.9       Signed By: Asa SaunasMasood Brasen Bundren, MD     February 08, 2016

## 2016-02-08 NOTE — Progress Notes (Signed)
Received report and assumed care of pt. Heparin gtt verified with off-going RN. On assessment, pt sitting up in chair coughing, stating that the coughing "precedes chest pain". Pt currently denies chest pain. Assisted into bed. Admin morphine to decrease O2 demands and placed pt on 2L NC. Will monitor progress of pt.

## 2016-02-08 NOTE — Progress Notes (Signed)
TRANSFER - IN REPORT:    Verbal report received from Dorice LamasWakeelah B., RN on Shane CodeRonald W Mann  being received from [facility] for ordered procedure      Report consisted of patient???s Situation, Background, Assessment and   Recommendations(SBAR).     Information from the following report(s) SBAR, Intake/Output and MAR was reviewed with the receiving nurse.    Opportunity for questions and clarification was provided.      Assessment completed upon patient???s arrival to unit and care assumed.

## 2016-02-08 NOTE — Progress Notes (Signed)
Chaplain conducted an initial consultation and Spiritual Assessment for Shane Mann, who is a 64 y.o.,male. Patient???s Primary Language is: [place].   According to the patient???s EMR Religious Affiliation is: Baptist.     The reason the Patient came to the hospital is:   Patient Active Problem List    Diagnosis Date Noted   ??? Chest pain 02/07/2016   ??? NSTEMI (non-ST elevated myocardial infarction) (HCC) 02/07/2016   ??? OSA (obstructive sleep apnea) 02/07/2016   ??? GBS (Guillain Barre syndrome) (HCC) 02/07/2016   ??? GERD (gastroesophageal reflux disease) 02/07/2016        The Chaplain provided the following Interventions:  Initiated a relationship of care and support.   Explored issues of faith, spirituality and/or religious needs while hospitalized.  Listened empathically.  Provided chaplaincy education.  Provided information about Spiritual Care Services.  Offered prayer and assurance of continued prayers on patient and family's behalf.   Chart reviewed.    The following outcomes were achieved:  Patient shared some information about their medical narrative and spiritual journey/beliefs.  Patient processed feeling about current hospitalization.  Patient expressed gratitude for the Chaplain's visit.    Assessment:  Patient did not indicate any spiritual or religious issues which require Spiritual Care Services interventions at this time.   Patient does not have any religious/cultural needs that will affect patient???s preferences in health care.    Plan:  Chaplains will continue to follow and will provide pastoral care on an as needed or requested basis.  Chaplain recommends bedside caregivers page chaplain on duty if patient shows signs of acute spiritual or emotional distress.      Chaplain T. Josph MachoWes Moore  Chaplain Intern  [phone]

## 2016-02-09 LAB — CBC W/O DIFF
HCT: 45.2 % (ref 36.0–48.0)
HGB: 16.1 g/dL — ABNORMAL HIGH (ref 13.0–16.0)
MCH: 32.1 PG (ref 24.0–34.0)
MCHC: 35.6 g/dL (ref 31.0–37.0)
MCV: 90 FL (ref 74.0–97.0)
MPV: 9.3 FL (ref 9.2–11.8)
PLATELET: 175 10*3/uL (ref 135–420)
RBC: 5.02 M/uL (ref 4.70–5.50)
RDW: 13.4 % (ref 11.6–14.5)
WBC: 9.6 10*3/uL (ref 4.6–13.2)

## 2016-02-09 LAB — MAGNESIUM: Magnesium: 1.8 mg/dL (ref 1.6–2.6)

## 2016-02-09 LAB — METABOLIC PANEL, BASIC
Anion gap: 10 mmol/L (ref 3.0–18)
BUN/Creatinine ratio: 15 (ref 12–20)
BUN: 15 MG/DL (ref 7.0–18)
CO2: 26 mmol/L (ref 21–32)
Calcium: 8.9 MG/DL (ref 8.5–10.1)
Chloride: 102 mmol/L (ref 100–108)
Creatinine: 0.99 MG/DL (ref 0.6–1.3)
GFR est AA: >60 mL/min/{1.73_m2} (ref >60)
GFR est non-AA: >60 mL/min/{1.73_m2} (ref >60)
Glucose: 104 mg/dL — ABNORMAL HIGH (ref 74–99)
Potassium: 3.6 mmol/L (ref 3.5–5.5)
Sodium: 138 mmol/L (ref 136–145)

## 2016-02-09 MED ORDER — ATORVASTATIN 40 MG TAB
40 mg | ORAL_TABLET | Freq: Every evening | ORAL | 3 refills | Status: AC
Start: 2016-02-09 — End: ?

## 2016-02-09 MED ORDER — METOPROLOL TARTRATE 50 MG TAB
50 mg | Freq: Two times a day (BID) | ORAL | Status: DC
Start: 2016-02-09 — End: 2016-02-09
  Administered 2016-02-09: 15:00:00 via ORAL

## 2016-02-09 MED ORDER — TICAGRELOR 90 MG TAB
90 mg | ORAL_TABLET | Freq: Two times a day (BID) | ORAL | 3 refills | Status: AC
Start: 2016-02-09 — End: ?

## 2016-02-09 MED ORDER — MAGNESIUM SULFATE IN D5W 1 GRAM/100 ML IV PIGGY BACK
1 gram/00 mL | Freq: Once | INTRAVENOUS | Status: AC
Start: 2016-02-09 — End: 2016-02-09
  Administered 2016-02-09: 15:00:00 via INTRAVENOUS

## 2016-02-09 MED ORDER — METOPROLOL TARTRATE 50 MG TAB
50 mg | ORAL_TABLET | Freq: Two times a day (BID) | ORAL | 3 refills | Status: DC
Start: 2016-02-09 — End: 2018-10-02

## 2016-02-09 MED ORDER — BENZONATATE 100 MG CAP
100 mg | ORAL_CAPSULE | Freq: Three times a day (TID) | ORAL | 1 refills | Status: AC | PRN
Start: 2016-02-09 — End: 2016-02-16

## 2016-02-09 MED ORDER — IPRATROPIUM-ALBUTEROL 2.5 MG-0.5 MG/3 ML NEB SOLUTION
2.5 mg-0.5 mg/3 ml | RESPIRATORY_TRACT | Status: DC
Start: 2016-02-09 — End: 2016-02-09
  Administered 2016-02-09: 14:00:00 via RESPIRATORY_TRACT

## 2016-02-09 MED ORDER — MAGNESIUM SULFATE 2 GRAM/50 ML IVPB
2 gram/50 mL (4 %) | Freq: Once | INTRAVENOUS | Status: DC
Start: 2016-02-09 — End: 2016-02-09

## 2016-02-09 MED ORDER — ALBUTEROL SULFATE 90 MCG/ACTUATION BREATH ACTIVATED POWDER INHALER
90 mcg/actuation | RESPIRATORY_TRACT | 3 refills | Status: AC | PRN
Start: 2016-02-09 — End: ?

## 2016-02-09 MED ORDER — POTASSIUM CHLORIDE 10 % ORAL LIQUID
20 mEq/15 mL | ORAL | Status: AC
Start: 2016-02-09 — End: 2016-02-09
  Administered 2016-02-09: 15:00:00 via ORAL

## 2016-02-09 MED ORDER — IPRATROPIUM-ALBUTEROL 2.5 MG-0.5 MG/3 ML NEB SOLUTION
2.5 mg-0.5 mg/3 ml | RESPIRATORY_TRACT | Status: DC
Start: 2016-02-09 — End: 2016-02-09
  Administered 2016-02-09: 16:00:00 via RESPIRATORY_TRACT

## 2016-02-09 MED ORDER — ASPIRIN 81 MG CHEWABLE TAB
81 mg | ORAL_TABLET | Freq: Every day | ORAL | 3 refills | Status: AC
Start: 2016-02-09 — End: ?

## 2016-02-09 MED FILL — ATORVASTATIN 20 MG TAB: 20 mg | ORAL | Qty: 2

## 2016-02-09 MED FILL — LOVENOX 40 MG/0.4 ML SUBCUTANEOUS SYRINGE: 40 mg/0.4 mL | SUBCUTANEOUS | Qty: 0.4

## 2016-02-09 MED FILL — DOCUSATE SODIUM 100 MG CAP: 100 mg | ORAL | Qty: 1

## 2016-02-09 MED FILL — GABAPENTIN 300 MG CAP: 300 mg | ORAL | Qty: 1

## 2016-02-09 MED FILL — MAGNESIUM SULFATE IN D5W 1 GRAM/100 ML IV PIGGY BACK: 1 gram/00 mL | INTRAVENOUS | Qty: 100

## 2016-02-09 MED FILL — ASPIRIN 81 MG CHEWABLE TAB: 81 mg | ORAL | Qty: 1

## 2016-02-09 MED FILL — POTASSIUM CHLORIDE 10 % ORAL LIQUID: 20 mEq/15 mL | ORAL | Qty: 30

## 2016-02-09 MED FILL — NITRO-BID 2 % TRANSDERMAL OINTMENT: 2 % | TRANSDERMAL | Qty: 1

## 2016-02-09 MED FILL — BRILINTA 90 MG TABLET: 90 mg | ORAL | Qty: 1

## 2016-02-09 MED FILL — METOPROLOL TARTRATE 50 MG TAB: 50 mg | ORAL | Qty: 1

## 2016-02-09 MED FILL — MORPHINE 4 MG/ML SYRINGE: 4 mg/mL | INTRAMUSCULAR | Qty: 2

## 2016-02-09 MED FILL — DULOXETINE 30 MG CAP, DELAYED RELEASE: 30 mg | ORAL | Qty: 1

## 2016-02-09 MED FILL — METOPROLOL TARTRATE 25 MG TAB: 25 mg | ORAL | Qty: 1

## 2016-02-09 MED FILL — IPRATROPIUM-ALBUTEROL 2.5 MG-0.5 MG/3 ML NEB SOLUTION: 2.5 mg-0.5 mg/3 ml | RESPIRATORY_TRACT | Qty: 3

## 2016-02-09 NOTE — Discharge Summary (Signed)
Discharge Summary by Michaelyn Barter, DO at 02/09/16 1432                Author: Michaelyn Barter, DO  Service: Internal Medicine  Author Type: Physician       Filed: 02/09/16 1447  Date of Service: 02/09/16 1432  Status: Signed          Editor: Michaelyn Barter, DO (Physician)                    [facility]   Hospitalist Division      Discharge Summary             Patient: Shane Mann  MRN: [id number]   CSN: [id number]          Date of Birth: June 23, 1951   Age: 65 y.o.   Sex: male      DOA: 02/07/2016  LOS:  LOS: 2 days    Discharge Date: 02/09/16        PCP:  Aundra Millet, MD      Chief Complaint:       Chief Complaint       Patient presents with        ?  Chest Pain        NSTEMI (non-ST elevated myocardial infarction) [facility])      Admission Diagnosis:       Hospital Problems as of 02/09/2016   Never Reviewed                Codes  Class  Noted - Resolved  POA              Chest pain  ICD-10-CM: R07.9   ICD-9-CM: 786.50    02/07/2016 - Present  Unknown                        * (Principal)NSTEMI (non-ST elevated myocardial infarction) (HCC)  ICD-10-CM: I21.4   ICD-9-CM: 410.70    02/07/2016 - Present  Unknown                        OSA (obstructive sleep apnea)  ICD-10-CM: U27.25   ICD-9-CM: 327.23    02/07/2016 - Present  Unknown                        GBS (Guillain Barre syndrome) (HCC)  ICD-10-CM: G61.0   ICD-9-CM: 357.0    02/07/2016 - Present  Unknown                        GERD (gastroesophageal reflux disease)  ICD-10-CM: K21.9   ICD-9-CM: 530.81    02/07/2016 - Present  Unknown                          Discharge Diagnoses:     NSTEMI with exertional angina, findings of 90% LAD stenosis   Tn=1.4 peak, with active chest pain and EKG changes   S/p LHC with 90% disease to LAD and 3 DES needs DAPT and 39yr brilinta   Serial Tn stable Tn at 1.4   Heparin gtt   ASA, BB, Statin   Cardiac Cath 6/21 Dr Tasia Catchings   TTE EF50-55%   Post-cardiac ventricular arrythmia  6/22 am monitoring and increasing dose lopressor      preDM - A1c 5.9   OSA-CPAP  Gout- hold allopurinol, colcrys   ED - on PDE5s none recent   GERD- PPI   Hx of GBS with neuropathy - resume neurontin+cymbalta      Hospital Course:    65 y.o. male with PMHX of OSA, Gout, ED, GERD, and GBS with neuropathy who presents from Dr Eyvonne LeftBrowders office with worsening substernal chest pain, dyspnea,  and cough which has been developing over the past 5 weeks.   ECG in Dr Eyvonne LeftBrowders office 1:50pm shows 0.155mm ST elevation and biphasic t waves in V1-V2  with persistent q waves in III and aVF.  These ST changes are gone on initial ECG in ER at 4pm, the  inferior Q waves [facility].  Cough resolved with oxygen. Initial Tn=1.0 patient was admitted with NSTEMI and heparinized.  His troponin peaked to 1.4 and remained there stable until cardiac catheterization on 6/22  Dr Tasia CatchingsAhmed:     Findings of 90% disease to LAD and 3 drug elluting stents placed he needs dual anti-platelet therapy with ASA81 and 4264yrs of brilinta, lopressor, and statin.  He will follow-up closely  with Dr Tasia CatchingsAhmed and Dr Peyton BottomsBrowder.  He was ready for discharge 6/22 morning however he had a 10 beet ventricular run, he was given magnesium and pottasium and watched until the afternoon with no further post-ischemic arrhythmias.  He had a slight wheeze and  cough when off oxygen and was prescribed albuterol with prn tessalon, the etiology is still unclear.  A1c 5.9 indicative of pre-diabetes.       Significant Diagnostic Studies:   CT head wnl         Consults:   Treatment Team: Attending Provider: Michaelyn BarterJames Andrew Kaylise Blakeley, DO; Consulting Provider: Michaelyn BarterJames Andrew Coltin Casher, DO; Consulting Provider: Asa SaunasMasood Ahmed, MD; Care Manager: Christean GriefBrandi B Sullivan,  RN      Operative Procedures:   1. Left heart catheterization.   2. Selective coronary angiogram.   3. Successful revascularization of the mid to proximal LAD with 3 drug-   eluting stents, from distal  to proximal is 2.25 x 16 mm Promus Premier   stent and then in the mid, 2.75 x 18 mm Resolute stent and then   proximally, 3.0 x 22 mm Resolute stent with excellent result.      Disposition:   Home      Diet:   Cardiac      Discharge Condition:    Good      Discharge Medications:       Current Discharge Medication List              START taking these medications          Details        aspirin 81 mg chewable tablet  Take 1 Tab by mouth daily.   Qty: 90 Tab, Refills:  3               atorvastatin (LIPITOR) 40 mg tablet  Take 1 Tab by mouth nightly.   Qty: 90 Tab, Refills:  3               metoprolol tartrate (LOPRESSOR) 50 mg tablet  Take 1 Tab by mouth every twelve (12) hours.   Qty: 180 Tab, Refills:  3               ticagrelor (BRILINTA) 90 mg tablet  Take 1 Tab by mouth two (2) times a day.   Qty: 180 Tab, Refills:  3               albuterol sulfate 90 mcg/actuation aepb  Take 2 Puffs by inhalation every four (4) hours as needed. Indications: cough/wheezing   Qty: 1 Inhaler, Refills:  3               benzonatate (TESSALON PERLES) 100 mg capsule  Take 1 Cap by mouth three (3) times daily as needed for Cough for up to 7 days.   Qty: 30 Cap, Refills:  1                     CONTINUE these medications which have NOT CHANGED          Details        gabapentin (NEURONTIN) 300 mg capsule  Take 300 mg by mouth four (4) times daily.               cholecalciferol, vitamin D3, (VITAMIN D3) 2,000 unit tab  Take 2,000 Int'l Units by mouth daily.               DULoxetine (CYMBALTA) 30 mg capsule  Take 30 mg by mouth two (2) times a day.               colchicine (COLCRYS) 0.6 mg tablet  Take 0.6 mg by mouth daily.               allopurinol (ZYLOPRIM) 100 mg tablet  Take 200 mg by mouth daily.                     STOP taking these medications                  ibuprofen (MOTRIN) 400 mg tablet  Comments:    Reason for Stopping:                                Follow-Up And Discharge Instructions:      Follow-up Information         Follow up With  Details  Comments  Contact Info             Aundra Millet, MD  Call in 2 weeks    [address]   [address] [place] [address]   [phone]                Asa Saunas, MD  Schedule an appointment as soon as possible for a visit  Cardiology  [address]   [address] [address] [place] [address]   [phone]                  Recent Results (from the past 24 hour(s))     METABOLIC PANEL, BASIC          Collection Time: 02/09/16  5:40 AM         Result  Value  Ref Range            Sodium  138  136 - 145 mmol/L       Potassium  3.6  3.5 - 5.5 mmol/L       Chloride  102  100 - 108 mmol/L       CO2  26  21 - 32 mmol/L       Anion gap  10  3.0 - 18 mmol/L  Glucose  104 (H)  74 - 99 mg/dL       BUN  15  7.0 - 18 MG/DL       Creatinine  1.61  0.6 - 1.3 MG/DL       BUN/Creatinine ratio  15  12 - 20         GFR est AA  >60  >60 ml/min/1.50m2       GFR est non-AA  >60  >60 ml/min/1.79m2       Calcium  8.9  8.5 - 10.1 MG/DL       CBC W/O DIFF          Collection Time: 02/09/16  5:40 AM         Result  Value  Ref Range            WBC  9.6  4.6 - 13.2 K/uL       RBC  5.02  4.70 - 5.50 M/uL       HGB  16.1 (H)  13.0 - 16.0 g/dL       HCT  09.6  04.5 - 48.0 %       MCV  90.0  74.0 - 97.0 FL       MCH  32.1  24.0 - 34.0 PG       MCHC  35.6  31.0 - 37.0 g/dL       RDW  40.9  81.1 - 14.5 %       PLATELET  175  135 - 420 K/uL       MPV  9.3  9.2 - 11.8 FL       MAGNESIUM          Collection Time: 02/09/16  5:40 AM         Result  Value  Ref Range            Magnesium  1.8  1.6 - 2.6 mg/dL       EKG, 12 LEAD, SUBSEQUENT          Collection Time: 02/09/16  9:09 AM         Result  Value  Ref Range            Ventricular Rate  78  BPM       Atrial Rate  78  BPM       P-R Interval  174  ms       QRS Duration  86  ms       Q-T Interval  388  ms       QTC Calculation (Bezet)  442  ms       Calculated P Axis  -11  degrees       Calculated R Axis  39  degrees       Calculated T Axis  64  degrees        Diagnosis                 Normal sinus rhythm   Septal infarct , age undetermined   Abnormal ECG   When compared with ECG of 07-Feb-2016 16:01,   Septal infarct is now present   No significant change was found              Wound Care:    N/A         Dr Malka So DO   [facility]   Hospitalist Division  Time Spent:  35min      Cc: Aundra MilletJason P Browder, MD

## 2016-02-09 NOTE — Progress Notes (Signed)
Patient armband removed and shredded  MyChart Activation    Thank you for requesting access to MyChart. Please follow the instructions below to securely access and download your online medical record. MyChart allows you to send messages to your doctor, view your test results, renew your prescriptions, schedule appointments, and more.    How Do I Sign Up?    1. In your internet browser, go to [email or web address]  2. Click on the First Time User? Click Here link in the Sign In box. You will be redirect to the New Member Sign Up page.  3. Enter your MyChart Access Code exactly as it appears below. You will not need to use this code after you???ve completed the sign-up process. If you do not sign up before the expiration date, you must request a new code.    MyChart Access Code: [email or web address]@ (This is the date your MyChart access code will expire)    4. Enter the last four digits of your Social Security Number (xxxx) and Date of Birth (mm/dd/yyyy) as indicated and click Submit. You will be taken to the next sign-up page.  5. Create a MyChart ID. This will be your MyChart login ID and cannot be changed, so think of one that is secure and easy to remember.  6. Create a MyChart password. You can change your password at any time.  7. Enter your Password Reset Question and Answer. This can be used at a later time if you forget your password.   8. Enter your e-mail address. You will receive e-mail notification when new information is available in MyChart.  9. Click Sign Up. You can now view and download portions of your medical record.  10. Click the Download Summary menu link to download a portable copy of your medical information.    Additional Information    If you have questions, please visit the Frequently Asked Questions section of the MyChart website at [email or web address] Remember, MyChart is NOT to be used for urgent needs. For medical emergencies, dial 911.

## 2016-02-09 NOTE — Progress Notes (Signed)
Daily Progress Note: 02/09/2016 1:46 PM   Admit Date: 02/07/2016    Patient seen in follow up for multiple medical problems as listed below:  Patient Active Problem List   Diagnosis Code   ??? Chest pain R07.9   ??? NSTEMI (non-ST elevated myocardial infarction) (HCC) I21.4   ??? OSA (obstructive sleep apnea) G47.33   ??? GBS (Guillain Barre syndrome) (HCC) G61.0   ??? GERD (gastroesophageal reflux disease) K21.9       Assesment     NSTEMI with exertional angina  Tn=1.4 peak, with active chest pain and EKG changes  S/p LHC with 90% disease to LAD and 3 DES needs DAPT and 1348yr brilinta  Serial Tn stable Tn at 1.4  Heparin gtt  ASA, BB, Statin  Cardiac Cath 6/21 Dr Tasia CatchingsAhmed  TTE EF50-55%  Post-cardiac ventricular arrythmia 6/22 am monitoring and increasing dose lopressor  ??  OSA-CPAP  Gout - hold allopurinol, colcrys  ED - on PDE5s none recent  GERD - PPI  Hx of GBS with neuropathy - resume neurontin+cymbalta    DVT Protocol Active: heparin  Code Status:  Full Code     Disposition: home today/tomorrow    Subjective:     CC: Chest Pain    Interval History: still with cough when not on 02. Has trace exp wheeze. Tele showing some ventricular runs increasing lopressor and monitoring. No further chest pain    ROS: 11 point ROS negative except for cough.    Objective:     Visit Vitals   ??? BP 140/80   ??? Pulse 75   ??? Temp 98.3 ??F (36.8 ??C)   ??? Resp 16   ??? Ht 5\' 11"  (1.803 m)   ??? Wt 110.3 kg (243 lb 2.7 oz)   ??? SpO2 92%   ??? BMI 33.91 kg/m2       Temp (24hrs), Avg:98 ??F (36.7 ??C), Min:97.4 ??F (36.3 ??C), Max:98.5 ??F (36.9 ??C)        Intake/Output Summary (Last 24 hours) at 02/09/16 0945  Last data filed at 02/09/16 0604   Gross per 24 hour   Intake            668.2 ml   Output             2225 ml   Net          -1556.8 ml       Gen: AOx3, NAD, NC on  HEENT:  PERL, EOMI.   Neck: No Bruits/JVD   Lungs:   CTAB. Good respiratory effort  Heart:   RR S1 S2 without M/R/G  Abdomen: ND,NT, BSX4,   Extremities:   No LE edema. No cyanosis.   Skin:  no jaundice/lesions      Data Review:     Meds/Labs/Tests reviewed    Current Shift:     Last three shifts:  06/20 1901 - 06/22 0700  In: 727.3 [P.O.:300; I.V.:427.3]  Out: 2425 [Urine:2425]  Recent Labs      02/09/16   0540  02/08/16   0655  02/07/16   2105  02/07/16   1615   WBC  9.6  9.3  9.7  7.6   RBC  5.02  5.06  5.37  5.33   HGB  16.1*  16.1*  17.4*  17.1*   HCT  45.2  45.6  48.3*  47.4   PLT  175  185  216  200   GRANS   --    --    --  57   LYMPH   --    --    --   31   EOS   --    --    --   3       Recent Labs      02/09/16   0540  02/08/16   0655  02/07/16   1615   BUN  15  18  17    CREA  0.99  1.15  1.21   CA  8.9  8.8  8.9   ALB   --    --   3.7   K  3.6  3.7  3.9   NA  138  142  142   CL  102  105  106   CO2  26  28  26    GLU  104*  120*  120*        Lab Results   Component Value Date/Time    Glucose 104 02/09/2016 05:40 AM    Glucose 120 02/08/2016 06:55 AM    Glucose 120 02/07/2016 04:15 PM          Care coordination with Nursing/Consultants/staff: 10  Prior history, labs, and charting reviewed: 15    Procedures/Imaging:  TTE 6/21  Cardiac Cath 6/21    Total time spent with chart review, patient examination/education, discussion with staff on case,documentation and medication management / adjustment  :  30 Minutes      Dr Malka SoJames A Kentrell Hallahan DO  [facility]  Hospitalist Division  Pager: [phone]

## 2016-02-09 NOTE — Progress Notes (Signed)
30 supply of Brilinta picked up from outpt pharmacy and delivered to pt's room. Teaching reinforced with emphasis on compliance- pt and spouse demonstrated understanding via teach back.

## 2016-02-09 NOTE — Progress Notes (Addendum)
0730. In bed, wife at bedside, denied any c/o pain, assessment completed at bedside. Dry coughing spill, will discuss w/ MD during round, wife stated that they have nothing for that cough. Right wrist approach site dry and intact, immobilizer in place.  1000. Dr. Janee Mornhompson and Ahmed at bedisde, recommendation for d/c today in the afternoon if no new escape beat.  Will walk pt around the unit after lunch, wife at bedside.   1200.  In chair, meds given as ordered. No sign of discomfort noted.  1300. Walked with pt in hallways, no change in VS, no c/o dizziness. Back to room.  1330. Called Dr. Janee Mornhompson to notify of pt progress , agreed to d/c pt home.  1400. Called Dr. Tasia CatchingsAhmed, informed of pt progress, agreed to d/c pt home and wants pt to make appt to see him in a month, information given to pt. D/c info given to pt in presence of family members.  1445. Walked pt to front of hospital w/ family at side, left home in private vehicle.

## 2016-02-09 NOTE — Progress Notes (Signed)
Cardiology Progress Note        Patient: Shane Mann        Sex: male          DOA: 02/07/2016  Date of Birth:  [birth year]      Age:  65 y.o.        LOS:  LOS: 2 days   Assessment/Plan     Principal Problem:    NSTEMI (non-ST elevated myocardial infarction) (HCC) (02/07/2016)    Active Problems:    Chest pain (02/07/2016)      OSA (obstructive sleep apnea) (02/07/2016)      GBS (Guillain Barre syndrome) (HCC) (02/07/2016)      GERD (gastroesophageal reflux disease) (02/07/2016)        Plan:  Feeling better  5 beat VT, K 3.6, will check magnesium  Increase metoprolol to 50 mg po BID  Ambulate the patient. If remained asymptomatic on ambulation, stable cardiac tele, pt may be dischrged today  Discussed with patient and wife and dr. Janee Mornhompson                  Subjective:    cc:  No CP      REVIEW OF SYSTEMS: NO CP, SOB, N,V,D, F,C  Objective:      Visit Vitals   ??? BP 127/63   ??? Pulse 79   ??? Temp 98.3 ??F (36.8 ??C)   ??? Resp 16   ??? Ht 5\' 11"  (1.803 m)   ??? Wt 110.3 kg (243 lb 2.7 oz)   ??? SpO2 93%   ??? BMI 33.91 kg/m2     Body mass index is 33.91 kg/(m^2).    Physical Exam:  General Appearance: Comfortable, not using accessory muscles of respiration.  HEENT: PERLA.   HEAD: Atraumatic  NECK: No JVD,   LUNGS: Clear bilaterally.   HEART: S1+S2     ABD: Non-tender, BS Audible    EXT: No edema, and no cysnosis.  VASCULAR EXAM: Pulses are intact.    PSYCHIATRIC EXAM: Mood is appropriate.    Medication:  Current Facility-Administered Medications   Medication Dose Route Frequency   ??? albuterol-ipratropium (DUO-NEB) 2.5 MG-0.5 MG/3 ML  3 mL Nebulization Q4HWA   ??? metoprolol tartrate (LOPRESSOR) tablet 50 mg  50 mg Oral Q12H   ??? 0.9% sodium chloride infusion  25 mL/hr IntraVENous CONTINUOUS   ??? 0.9% sodium chloride infusion  25 mL/hr IntraVENous CONTINUOUS   ??? bivalirudin (ANGIOMAX) 250 mg in 0.9% sodium chloride (MBP/ADV) 50 mL infusion  1.75 mg/kg/hr IntraVENous CONTINUOUS    ??? nitroglycerin (TRIDIL) 400 mcg/ml infusion  5-20 mcg/min IntraVENous TITRATE   ??? sodium chloride (NS) flush 5-10 mL  5-10 mL IntraVENous PRN   ??? ticagrelor (BRILINTA) tablet 90 mg  90 mg Oral BID   ??? enoxaparin (LOVENOX) injection 40 mg  40 mg SubCUTAneous Q24H   ??? nitroglycerin (NITROSTAT) tablet 0.4 mg  0.4 mg SubLINGual PRN   ??? nitroglycerin (NITROBID) 2 % ointment 1 Inch  1 Inch Topical BID   ??? DULoxetine (CYMBALTA) capsule 30 mg  30 mg Oral BID   ??? gabapentin (NEURONTIN) capsule 300 mg  300 mg Oral QID   ??? sodium chloride (NS) flush 5-10 mL  5-10 mL IntraVENous Q8H   ??? sodium chloride (NS) flush 5-10 mL  5-10 mL IntraVENous PRN   ??? magnesium hydroxide (MILK OF MAGNESIA) 400 mg/5 mL oral suspension 30 mL  30 mL Oral DAILY PRN   ??? docusate sodium (COLACE)  capsule 100 mg  100 mg Oral BID   ??? HYDROcodone-acetaminophen (NORCO) 5-325 mg per tablet 1 Tab  1 Tab Oral Q4H PRN   ??? morphine injection 5 mg  5 mg IntraVENous Q4H PRN   ??? ondansetron (ZOFRAN ODT) tablet 4 mg  4 mg Oral Q6H PRN   ??? atorvastatin (LIPITOR) tablet 40 mg  40 mg Oral QHS   ??? aspirin chewable tablet 81 mg  81 mg Oral DAILY               Lab/Data Reviewed:  All lab results for the last 24 hours reviewed.     Recent Labs      02/09/16   0540  02/08/16   0655  02/07/16   2105   WBC  9.6  9.3  9.7   HGB  16.1*  16.1*  17.4*   HCT  45.2  45.6  48.3*   PLT  175  185  216     Recent Labs      02/09/16   0540  02/08/16   0655  02/07/16   1615   NA  138  142  142   K  3.6  3.7  3.9   CL  102  105  106   CO2  26  28  26    GLU  104*  120*  120*   BUN  15  18  17    CREA  0.99  1.15  1.21   CA  8.9  8.8  8.9       Signed By: Asa SaunasMasood Gurbani Figge, MD     February 09, 2016

## 2016-02-09 NOTE — Other (Signed)
GLYCEMIC CONTROL & NUTRITION:    - Discussed in rounds, no documented h/o DM, current A1C meets criteria for pre-DM, 5.9%  - BG currently within target range  - recommend follow-up with OP PCM for continued monitoring of A1C and to discuss if treatment if necessary  - encouraged OP DM Self Management Class (schedule given)    Recent Glucose Results:   Lab Results   Component Value Date/Time    GLU 104 (H) 02/09/2016 05:40 AM     Lab Results   Component Value Date/Time    Hemoglobin A1c 5.9 02/08/2016 06:55 AM       M. Remonia Richteratherine Brown, MPH, RD

## 2016-02-09 NOTE — Other (Signed)
CRITICAL CARE INTERDISCIPLINARY ROUNDS    Patient Information:    Name:   Shane Mann    Age:   65 y.o.    Admission Date:   02/07/2016    Critical Care Day: 2    Surgery Date:      Attending Provider:   Michaelyn BarterJames Andrew Thompson, DO    Surgeon:       Consultant(s):   Ahmed  Critical Care Physician:  Langley AdieShamma    Code Status: Full Code    Problem List:     Patient Active Problem List   Diagnosis Code   ??? Chest pain R07.9   ??? NSTEMI (non-ST elevated myocardial infarction) (HCC) I21.4   ??? OSA (obstructive sleep apnea) G47.33   ??? GBS (Guillain Barre syndrome) (HCC) G61.0   ??? GERD (gastroesophageal reflux disease) K21.9       Principal Problem:  NSTEMI (non-ST elevated myocardial infarction) (HCC)    During rounds the following quality care indicators and evidence based practices were addressed :  DVT Prophylaxis and PUD Prophylaxis Foley Day ([facility]-Care ) ; Central Line Day: Isolation:; Antibiotic Stewardship: ; Probiotics Necessary:       Glycemic Control:   Recent Labs      02/09/16   0540  02/08/16   0655  02/07/16   1615   GLU  104*  120*  120*   ;No results for input(s): PHI, PCO2I, PO2I in the last 72 hours.    No lab exists for component: 3 Adjustments     Acute MI/PCI:   ASA on arrival  , Beta Blocker , Statin  and Antiplatelet    Door to Balloon: Admission Time: 1525 Procedure:  (LHC) (02/08/16 1350)    Heart Failure:    Not applicable     SCIP Measures for other Surgeries:   Not applicable CHG Bath Daily on All Ortho    Pneumonia:    Not applicable    Interdisciplinary team rounds were held with the following team membersDiabetes Treatment Specialist, Infection Control, Nursing, Nutrition, Pharmacy, Physician, Respiratory Therapy and Clinical Coordinator. Plan of care discussed.     Goals of Care/ Recommendations: will plan for discharge. Ambulate in unit on monitor to check rythymnTeaching for anti platelet, possible discharge.  See clinical pathway and/or care plan for interventions and desired outcomes.     Critical Care Discharge Status:  [place] SiGreen

## 2016-02-09 NOTE — Discharge Summary (Signed)
[facility]  Hospitalist Division    Discharge Summary      Patient: Shane Mann MRN: [id number]  CSN: [id number]    Date of Birth: 10/01/1950  Age: 65 y.o.  Sex: male    DOA: 02/07/2016 LOS:  LOS: 2 days   Discharge Date: 02/09/16     PCP:  Aundra MilletJason P Browder, MD    Chief Complaint:    Chief Complaint   Patient presents with   ??? Chest Pain     NSTEMI (non-ST elevated myocardial infarction) [facility](HCC)    Admission Diagnosis:   Hospital Problems as of 02/09/2016  Never Reviewed          Codes Class Noted - Resolved POA    Chest pain ICD-10-CM: R07.9  ICD-9-CM: 786.50  02/07/2016 - Present Unknown        * (Principal)NSTEMI (non-ST elevated myocardial infarction) (HCC) ICD-10-CM: I21.4  ICD-9-CM: 410.70  02/07/2016 - Present Unknown        OSA (obstructive sleep apnea) ICD-10-CM: Z30.86G47.33  ICD-9-CM: 327.23  02/07/2016 - Present Unknown        GBS (Guillain Barre syndrome) (HCC) ICD-10-CM: G61.0  ICD-9-CM: 357.0  02/07/2016 - Present Unknown        GERD (gastroesophageal reflux disease) ICD-10-CM: K21.9  ICD-9-CM: 530.81  02/07/2016 - Present Unknown              Discharge Diagnoses:    NSTEMI with exertional angina, findings of 90% LAD stenosis  Tn=1.4 peak, with active chest pain and EKG changes  S/p LHC with 90% disease to LAD and 3 DES needs DAPT and 7751yr brilinta  Serial Tn stable Tn at 1.4  Heparin gtt  ASA, BB, Statin  Cardiac Cath 6/21 Dr Tasia CatchingsAhmed  TTE EF50-55%  Post-cardiac ventricular arrythmia 6/22 am monitoring and increasing dose lopressor  ????  preDM - A1c 5.9  OSA-CPAP  Gout??- hold allopurinol, colcrys  ED - on PDE5s none recent  GERD??- PPI  Hx of GBS with neuropathy - resume neurontin+cymbalta    Hospital Course:   65 y.o. male with PMHX of OSA, Gout, ED, GERD, and GBS with neuropathy who presents from Dr Eyvonne LeftBrowders office with worsening substernal chest pain, dyspnea, and cough which has been developing over the past 5 weeks.   ECG  in Dr Eyvonne LeftBrowders office 1:50pm shows 0.665mm ST elevation and biphasic t waves in V1-V2  with persistent q waves in III and aVF.  These ST changes are gone on initial ECG in ER at 4pm, the inferior Q waves are present back from [facility] ECG since 2013.  Cough resolved with oxygen. Initial Tn=1.0 patient was admitted with NSTEMI and heparinized.  His troponin peaked to 1.4 and remained there stable until cardiac catheterization on 6/22 Dr Tasia CatchingsAhmed:    Findings of 90% disease to LAD and 3 drug elluting stents placed he needs dual anti-platelet therapy with ASA81 and 1951yrs of brilinta, lopressor, and statin.  He will follow-up closely with Dr Tasia CatchingsAhmed and Dr Peyton BottomsBrowder.  He was ready for discharge 6/22 morning however he had a 10 beet ventricular run, he was given magnesium and pottasium and watched until the afternoon with no further post-ischemic arrhythmias.  He had a slight wheeze and cough when off oxygen and was prescribed albuterol with prn tessalon, the etiology is still unclear.  A1c 5.9 indicative of pre-diabetes.   ??  Significant Diagnostic Studies:  CT head wnl      Consults:  Treatment Team: Attending Provider: Fayrene FearingJames  Antony Contras, DO; Consulting Provider: Michaelyn Barter, DO; Consulting Provider: Asa Saunas, MD; Care Manager: Christean Grief, RN    Operative Procedures:  1. Left heart catheterization.  2. Selective coronary angiogram.  3. Successful revascularization of the mid to proximal LAD with 3 drug-  eluting stents, from distal to proximal is 2.25 x 16 mm Promus Premier  stent and then in the mid, 2.75 x 18 mm Resolute stent and then  proximally, 3.0 x 22 mm Resolute stent with excellent result.    Disposition:  Home    Diet:  Cardiac    Discharge Condition:   Good    Discharge Medications:    Current Discharge Medication List      START taking these medications    Details   aspirin 81 mg chewable tablet Take 1 Tab by mouth daily.  Qty: 90 Tab, Refills: 3       atorvastatin (LIPITOR) 40 mg tablet Take 1 Tab by mouth nightly.  Qty: 90 Tab, Refills: 3      metoprolol tartrate (LOPRESSOR) 50 mg tablet Take 1 Tab by mouth every twelve (12) hours.  Qty: 180 Tab, Refills: 3      ticagrelor (BRILINTA) 90 mg tablet Take 1 Tab by mouth two (2) times a day.  Qty: 180 Tab, Refills: 3      albuterol sulfate 90 mcg/actuation aepb Take 2 Puffs by inhalation every four (4) hours as needed. Indications: cough/wheezing  Qty: 1 Inhaler, Refills: 3      benzonatate (TESSALON PERLES) 100 mg capsule Take 1 Cap by mouth three (3) times daily as needed for Cough for up to 7 days.  Qty: 30 Cap, Refills: 1         CONTINUE these medications which have NOT CHANGED    Details   gabapentin (NEURONTIN) 300 mg capsule Take 300 mg by mouth four (4) times daily.      cholecalciferol, vitamin D3, (VITAMIN D3) 2,000 unit tab Take 2,000 Int'l Units by mouth daily.      DULoxetine (CYMBALTA) 30 mg capsule Take 30 mg by mouth two (2) times a day.      colchicine (COLCRYS) 0.6 mg tablet Take 0.6 mg by mouth daily.      allopurinol (ZYLOPRIM) 100 mg tablet Take 200 mg by mouth daily.         STOP taking these medications       ibuprofen (MOTRIN) 400 mg tablet Comments:   Reason for Stopping:                 Follow-Up And Discharge Instructions:   Follow-up Information     Follow up With Details Comments Contact Info    Aundra Millet, MD Call in 2 weeks  [address]  [address] [place] [address]  [phone]      Asa Saunas, MD Schedule an appointment as soon as possible for a visit Cardiology [address] [facility]  [address] [place] [address]  [phone]          Recent Results (from the past 24 hour(s))   METABOLIC PANEL, BASIC    Collection Time: 02/09/16  5:40 AM   Result Value Ref Range    Sodium 138 136 - 145 mmol/L    Potassium 3.6 3.5 - 5.5 mmol/L    Chloride 102 100 - 108 mmol/L    CO2 26 21 - 32 mmol/L    Anion gap 10 3.0 - 18  mmol/L    Glucose 104 (H) 74 - 99 mg/dL     BUN 15 7.0 - 18 MG/DL    Creatinine 1.610.99 0.6 - 1.3 MG/DL    BUN/Creatinine ratio 15 12 - 20      GFR est AA >60 >60 ml/min/1.8873m2    GFR est non-AA >60 >60 ml/min/1.6273m2    Calcium 8.9 8.5 - 10.1 MG/DL   CBC W/O DIFF    Collection Time: 02/09/16  5:40 AM   Result Value Ref Range    WBC 9.6 4.6 - 13.2 K/uL    RBC 5.02 4.70 - 5.50 M/uL    HGB 16.1 (H) 13.0 - 16.0 g/dL    HCT 09.645.2 04.536.0 - 40.948.0 %    MCV 90.0 74.0 - 97.0 FL    MCH 32.1 24.0 - 34.0 PG    MCHC 35.6 31.0 - 37.0 g/dL    RDW 81.113.4 91.411.6 - 78.214.5 %    PLATELET 175 135 - 420 K/uL    MPV 9.3 9.2 - 11.8 FL   MAGNESIUM    Collection Time: 02/09/16  5:40 AM   Result Value Ref Range    Magnesium 1.8 1.6 - 2.6 mg/dL   EKG, 12 LEAD, SUBSEQUENT    Collection Time: 02/09/16  9:09 AM   Result Value Ref Range    Ventricular Rate 78 BPM    Atrial Rate 78 BPM    P-R Interval 174 ms    QRS Duration 86 ms    Q-T Interval 388 ms    QTC Calculation (Bezet) 442 ms    Calculated P Axis -11 degrees    Calculated R Axis 39 degrees    Calculated T Axis 64 degrees    Diagnosis       Normal sinus rhythm  Septal infarct , age undetermined  Abnormal ECG  When compared with ECG of 07-Feb-2016 16:01,  Septal infarct is now present  No significant change was found         Wound Care:   N/A      Dr Malka SoJames A Linah Klapper DO  [facility]  Hospitalist Division        Time Spent:  35min    Cc: Aundra MilletJason P Browder, MD

## 2016-02-10 LAB — EKG 12-LEAD
Atrial Rate: 89 {beats}/min
Diagnosis: NORMAL
P Axis: -3 degrees
P-R Interval: 162 ms
Q-T Interval: 368 ms
QRS Duration: 84 ms
QTc Calculation (Bazett): 447 ms
R Axis: 11 degrees
T Axis: 78 degrees
Ventricular Rate: 89 {beats}/min

## 2016-02-10 LAB — EKG, 12 LEAD, INITIAL
Atrial Rate: 89 {beats}/min
Calculated P Axis: -3 degrees
Calculated R Axis: 11 degrees
Calculated T Axis: 78 degrees
Diagnosis: NORMAL
P-R Interval: 162 ms
Q-T Interval: 368 ms
QRS Duration: 84 ms
QTC Calculation (Bezet): 447 ms
Ventricular Rate: 89 {beats}/min

## 2016-02-11 LAB — EKG 12-LEAD
Atrial Rate: 78 {beats}/min
Diagnosis: NORMAL
P Axis: -11 degrees
P-R Interval: 174 ms
Q-T Interval: 388 ms
QRS Duration: 86 ms
QTc Calculation (Bazett): 442 ms
R Axis: 39 degrees
T Axis: 64 degrees
Ventricular Rate: 78 {beats}/min

## 2016-02-11 LAB — EKG, 12 LEAD, SUBSEQUENT
Atrial Rate: 78 {beats}/min
Calculated P Axis: -11 degrees
Calculated R Axis: 39 degrees
Calculated T Axis: 64 degrees
Diagnosis: NORMAL
P-R Interval: 174 ms
Q-T Interval: 388 ms
QRS Duration: 86 ms
QTC Calculation (Bezet): 442 ms
Ventricular Rate: 78 {beats}/min

## 2016-12-12 ENCOUNTER — Encounter

## 2016-12-19 ENCOUNTER — Inpatient Hospital Stay: Admit: 2016-12-19 | Payer: MEDICARE | Attending: Orthopaedic Surgery | Primary: Internal Medicine

## 2016-12-19 DIAGNOSIS — M4726 Other spondylosis with radiculopathy, lumbar region: Secondary | ICD-10-CM

## 2018-07-28 ENCOUNTER — Encounter

## 2018-07-31 ENCOUNTER — Inpatient Hospital Stay: Admit: 2018-07-31 | Payer: MEDICARE | Attending: Urology | Primary: Internal Medicine

## 2018-07-31 DIAGNOSIS — R310 Gross hematuria: Secondary | ICD-10-CM

## 2018-07-31 LAB — AMB POC CREATININE
Creatinine, POC: 1 MG/DL (ref 0.6–1.3)
GFR African American: >60 mL/min/{1.73_m2} (ref >60)
GFR Non-African American: >60 mL/min/{1.73_m2} (ref >60)

## 2018-07-31 LAB — CREATININE, POC
Creatinine, POC: 1 MG/DL (ref 0.6–1.3)
GFRAA, POC: >60 mL/min/{1.73_m2} (ref >60)
GFRNA, POC: >60 mL/min/{1.73_m2} (ref >60)

## 2018-07-31 MED ORDER — IOPAMIDOL 76 % IV SOLN
370 mg iodine /mL (76 %) | Freq: Once | INTRAVENOUS | Status: AC
Start: 2018-07-31 — End: 2018-07-31
  Administered 2018-07-31: 21:00:00 via INTRAVENOUS

## 2018-07-31 MED FILL — ISOVUE-370  76 % INTRAVENOUS SOLUTION: 370 mg iodine /mL (76 %) | INTRAVENOUS | Qty: 100

## 2018-10-02 ENCOUNTER — Inpatient Hospital Stay: Payer: MEDICARE

## 2018-10-02 LAB — CBC
Hematocrit: 47.9 % (ref 36.0–48.0)
Hemoglobin: 15.5 g/dL (ref 13.0–16.0)
MCH: 27.4 PG (ref 24.0–34.0)
MCHC: 32.4 g/dL (ref 31.0–37.0)
MCV: 84.6 FL (ref 74.0–97.0)
MPV: 9.5 FL (ref 9.2–11.8)
Platelets: 222 10*3/uL (ref 135–420)
RBC: 5.66 M/uL — ABNORMAL HIGH (ref 4.70–5.50)
RDW: 14.1 % (ref 11.6–14.5)
WBC: 6.6 10*3/uL (ref 4.6–13.2)

## 2018-10-02 LAB — EKG 12-LEAD
Atrial Rate: 76 {beats}/min
Diagnosis: NORMAL
P Axis: -20 degrees
P-R Interval: 166 ms
Q-T Interval: 396 ms
QRS Duration: 92 ms
QTc Calculation (Bazett): 445 ms
R Axis: 10 degrees
T Axis: 68 degrees
Ventricular Rate: 76 {beats}/min

## 2018-10-02 LAB — BASIC METABOLIC PANEL
Anion Gap: 6 mmol/L (ref 3.0–18)
BUN: 15 MG/DL (ref 7.0–18)
Bun/Cre Ratio: 14 (ref 12–20)
CO2: 27 mmol/L (ref 21–32)
Calcium: 8.9 MG/DL (ref 8.5–10.1)
Chloride: 108 mmol/L (ref 100–111)
Creatinine: 1.07 MG/DL (ref 0.6–1.3)
EGFR IF NonAfrican American: >60 mL/min/{1.73_m2} (ref >60)
GFR African American: >60 mL/min/{1.73_m2} (ref >60)
Glucose: 129 mg/dL — ABNORMAL HIGH (ref 74–99)
Potassium: 3.7 mmol/L (ref 3.5–5.5)
Sodium: 141 mmol/L (ref 136–145)

## 2018-10-02 LAB — LIPID PANEL
CHOL/HDL Ratio: 5.9 — ABNORMAL HIGH (ref 0–5.0)
Chol/HDL Ratio: 5.9 — ABNORMAL HIGH (ref 0–5.0)
Cholesterol, Total: 200 MG/DL — ABNORMAL HIGH (ref <200)
Cholesterol, total: 200 MG/DL — ABNORMAL HIGH (ref <200)
HDL Cholesterol: 34 MG/DL — ABNORMAL LOW (ref 40–60)
HDL: 34 MG/DL — ABNORMAL LOW (ref 40–60)
LDL Calculated: 132.2 MG/DL — ABNORMAL HIGH (ref 0–100)
LDL, calculated: 132.2 MG/DL — ABNORMAL HIGH (ref 0–100)
Triglyceride: 169 MG/DL — ABNORMAL HIGH (ref <150)
Triglycerides: 169 MG/DL — ABNORMAL HIGH (ref <150)
VLDL Cholesterol Calculated: 33.8 MG/DL
VLDL, calculated: 33.8 MG/DL

## 2018-10-02 LAB — PROTIME-INR
INR: 1 (ref 0.8–1.2)
Protime: 13.1 s (ref 11.5–15.2)

## 2018-10-02 LAB — POC ACTIVATED CLOTTING TIME
Activated Clotting Time (POC): 424 SECS — ABNORMAL HIGH (ref 79–138)
Activated Clotting Time: 424 SECS — ABNORMAL HIGH (ref 79–138)

## 2018-10-02 LAB — MAGNESIUM
Magnesium: 2.1 mg/dL (ref 1.6–2.6)
Magnesium: 2.1 mg/dL (ref 1.6–2.6)

## 2018-10-02 LAB — EKG, 12 LEAD, INITIAL
Atrial Rate: 76 {beats}/min
Calculated P Axis: -20 degrees
Calculated R Axis: 10 degrees
Calculated T Axis: 68 degrees
Diagnosis: NORMAL
P-R Interval: 166 ms
Q-T Interval: 396 ms
QRS Duration: 92 ms
QTC Calculation (Bezet): 445 ms
Ventricular Rate: 76 {beats}/min

## 2018-10-02 LAB — METABOLIC PANEL, BASIC
Anion gap: 6 mmol/L (ref 3.0–18)
BUN/Creatinine ratio: 14 (ref 12–20)
BUN: 15 MG/DL (ref 7.0–18)
CO2: 27 mmol/L (ref 21–32)
Calcium: 8.9 MG/DL (ref 8.5–10.1)
Chloride: 108 mmol/L (ref 100–111)
Creatinine: 1.07 MG/DL (ref 0.6–1.3)
GFR est AA: >60 mL/min/{1.73_m2} (ref >60)
GFR est non-AA: >60 mL/min/{1.73_m2} (ref >60)
Glucose: 129 mg/dL — ABNORMAL HIGH (ref 74–99)
Potassium: 3.7 mmol/L (ref 3.5–5.5)
Sodium: 141 mmol/L (ref 136–145)

## 2018-10-02 LAB — CBC W/O DIFF
HCT: 47.9 % (ref 36.0–48.0)
HGB: 15.5 g/dL (ref 13.0–16.0)
MCH: 27.4 PG (ref 24.0–34.0)
MCHC: 32.4 g/dL (ref 31.0–37.0)
MCV: 84.6 FL (ref 74.0–97.0)
MPV: 9.5 FL (ref 9.2–11.8)
PLATELET: 222 10*3/uL (ref 135–420)
RBC: 5.66 M/uL — ABNORMAL HIGH (ref 4.70–5.50)
RDW: 14.1 % (ref 11.6–14.5)
WBC: 6.6 10*3/uL (ref 4.6–13.2)

## 2018-10-02 LAB — PROTHROMBIN TIME + INR
INR: 1 (ref 0.8–1.2)
Prothrombin time: 13.1 s (ref 11.5–15.2)

## 2018-10-02 MED ORDER — HEPARIN (PORCINE) 1,000 UNIT/ML IJ SOLN
1000 unit/mL | INTRAMUSCULAR | Status: AC
Start: 2018-10-02 — End: ?

## 2018-10-02 MED ORDER — HEPARIN (PORCINE) IN NS (PF) 1,000 UNIT/500 ML IV
1000 unit/500 mL | INTRAVENOUS | Status: AC
Start: 2018-10-02 — End: ?

## 2018-10-02 MED ORDER — SODIUM CHLORIDE 0.9 % IV PIGGY BACK
INTRAVENOUS | Status: AC
Start: 2018-10-02 — End: ?

## 2018-10-02 MED ORDER — BIVALIRUDIN 250 MG SOLUTION
250 mg | INTRAVENOUS | Status: AC
Start: 2018-10-02 — End: ?

## 2018-10-02 MED ORDER — LORAZEPAM 1 MG TAB
1 mg | Freq: Once | ORAL | Status: AC
Start: 2018-10-02 — End: 2018-10-02
  Administered 2018-10-02: 13:00:00 via ORAL

## 2018-10-02 MED ORDER — ASPIRIN 81 MG CHEWABLE TAB
81 mg | Freq: Once | ORAL | Status: AC
Start: 2018-10-02 — End: 2018-10-02
  Administered 2018-10-02: 13:00:00 via ORAL

## 2018-10-02 MED ORDER — MIDAZOLAM 1 MG/ML IJ SOLN
1 mg/mL | INTRAMUSCULAR | Status: DC | PRN
Start: 2018-10-02 — End: 2018-10-02
  Administered 2018-10-02: 15:00:00 via INTRAVENOUS

## 2018-10-02 MED ORDER — HEPARIN (PORCINE) 1,000 UNIT/ML IJ SOLN
1000 unit/mL | INTRAMUSCULAR | Status: DC | PRN
Start: 2018-10-02 — End: 2018-10-02
  Administered 2018-10-02: 15:00:00 via INTRAVENOUS

## 2018-10-02 MED ORDER — VERAPAMIL 2.5 MG/ML IV
2.5 mg/mL | INTRAVENOUS | Status: AC
Start: 2018-10-02 — End: ?

## 2018-10-02 MED ORDER — MIDAZOLAM (PF) 1 MG/ML INJECTION SOLUTION
1 mg/mL | INTRAMUSCULAR | Status: AC
Start: 2018-10-02 — End: ?

## 2018-10-02 MED ORDER — FENTANYL CITRATE (PF) 50 MCG/ML IJ SOLN
50 mcg/mL | INTRAMUSCULAR | Status: DC | PRN
Start: 2018-10-02 — End: 2018-10-02
  Administered 2018-10-02 (×2): via INTRAVENOUS

## 2018-10-02 MED ORDER — LIDOCAINE HCL 1 % (10 MG/ML) IJ SOLN
10 mg/mL (1 %) | INTRAMUSCULAR | Status: AC
Start: 2018-10-02 — End: ?

## 2018-10-02 MED ORDER — SODIUM CHLORIDE 0.9 % IV PIGGY BACK
250 mg | INTRAVENOUS | Status: DC | PRN
Start: 2018-10-02 — End: 2018-10-02
  Administered 2018-10-02: 15:00:00 via INTRAVENOUS

## 2018-10-02 MED ORDER — LIDOCAINE HCL 1 % (10 MG/ML) IJ SOLN
10 mg/mL (1 %) | INTRAMUSCULAR | Status: DC | PRN
Start: 2018-10-02 — End: 2018-10-02
  Administered 2018-10-02: 15:00:00 via INTRADERMAL

## 2018-10-02 MED ORDER — VERAPAMIL 2.5 MG/ML IV
2.5 mg/mL | INTRAVENOUS | Status: DC | PRN
Start: 2018-10-02 — End: 2018-10-02
  Administered 2018-10-02: 15:00:00 via INTRA_ARTERIAL

## 2018-10-02 MED ORDER — HEPARIN (PORCINE) IN NS (PF) 1,000 UNIT/500 ML IV
1000 unit/500 mL | INTRAVENOUS | Status: AC | PRN
Start: 2018-10-02 — End: 2018-10-02
  Administered 2018-10-02: 15:00:00

## 2018-10-02 MED ORDER — IOPAMIDOL 51 % IV SOLN
51 % | INTRAVENOUS | Status: DC | PRN
Start: 2018-10-02 — End: 2018-10-02
  Administered 2018-10-02: 16:00:00 via INTRA_ARTERIAL

## 2018-10-02 MED ORDER — FENTANYL CITRATE (PF) 50 MCG/ML IJ SOLN
50 mcg/mL | INTRAMUSCULAR | Status: AC
Start: 2018-10-02 — End: ?

## 2018-10-02 MED ORDER — NITROGLYCERIN 0.1 MG/ML (100 MCG/ML) D5W COMPOUNDED INJECTION
0.1 mg/mL | INTRAVENOUS | Status: DC | PRN
Start: 2018-10-02 — End: 2018-10-02
  Administered 2018-10-02: 15:00:00 via INTRA_ARTERIAL

## 2018-10-02 MED ORDER — SODIUM CHLORIDE 0.9 % IV
INTRAVENOUS | Status: DC
Start: 2018-10-02 — End: 2018-10-02
  Administered 2018-10-02: 13:00:00 via INTRAVENOUS

## 2018-10-02 MED ORDER — BIVALIRUDIN (ANGIOMAX) 5 MG/ML BOLUS NC
5 mg/mL | INTRAVENOUS | Status: DC | PRN
Start: 2018-10-02 — End: 2018-10-02
  Administered 2018-10-02: 15:00:00 via INTRAVENOUS

## 2018-10-02 MED FILL — MIDAZOLAM (PF) 1 MG/ML INJECTION SOLUTION: 1 mg/mL | INTRAMUSCULAR | Qty: 2

## 2018-10-02 MED FILL — VERAPAMIL 2.5 MG/ML IV: 2.5 mg/mL | INTRAVENOUS | Qty: 2

## 2018-10-02 MED FILL — SODIUM CHLORIDE 0.9 % IV PIGGY BACK: INTRAVENOUS | Qty: 50

## 2018-10-02 MED FILL — HEPARIN (PORCINE) 1,000 UNIT/ML IJ SOLN: 1000 unit/mL | INTRAMUSCULAR | Qty: 10

## 2018-10-02 MED FILL — BIVALIRUDIN 250 MG SOLUTION: 250 mg | INTRAVENOUS | Qty: 5

## 2018-10-02 MED FILL — LIDOCAINE HCL 1 % (10 MG/ML) IJ SOLN: 10 mg/mL (1 %) | INTRAMUSCULAR | Qty: 20

## 2018-10-02 MED FILL — ASPIRIN 81 MG CHEWABLE TAB: 81 mg | ORAL | Qty: 1

## 2018-10-02 MED FILL — HEPARIN (PORCINE) IN NS (PF) 1,000 UNIT/500 ML IV: 1000 unit/500 mL | INTRAVENOUS | Qty: 1000

## 2018-10-02 MED FILL — SODIUM CHLORIDE 0.9 % IV: INTRAVENOUS | Qty: 1000

## 2018-10-02 MED FILL — HEPARIN (PORCINE) IN NS (PF) 1,000 UNIT/500 ML IV: 1000 unit/500 mL | INTRAVENOUS | Qty: 500

## 2018-10-02 MED FILL — FENTANYL CITRATE (PF) 50 MCG/ML IJ SOLN: 50 mcg/mL | INTRAMUSCULAR | Qty: 2

## 2018-10-02 MED FILL — LORAZEPAM 1 MG TAB: 1 mg | ORAL | Qty: 1

## 2018-10-02 NOTE — Progress Notes (Signed)
Cardiology Progress Note        Patient: Shane Mann        Sex: male          DOA: 10/02/2018  Date of Birth:  April 27, 1951      Age:  68 y.o.        LOS:  LOS: 0 days   Assessment/Plan    cath procedure done.  Angiography reviewed with family.  Discussed with pt.  Complete report to follow    Signed By: Asa Saunas, MD     October 02, 2018

## 2018-10-02 NOTE — Progress Notes (Signed)
Prepped & ready for procedure.

## 2018-10-02 NOTE — Progress Notes (Signed)
Back from procedure. TR band intact to right radial. Family in. Diet given.   1219 TR band released, sterile 2x2 & Tegaderm applied with armboard, no bleeding or swelling.   1315 discharge instructions reviewed, verbalized understanding.   1330 Discharged home via w/c in stable condition in care of wife. Denies pain. Right arm dressing intact no bleeding or swelling. N/V intact to right arm.

## 2018-10-02 NOTE — Progress Notes (Signed)
Cardiology Progress Note        Patient: Shane Mann        Sex: male          DOA: 10/02/2018  Date of Birth:  1950/10/28      Age:  68 y.o.        LOS:  LOS: 0 days   Assessment/Plan   Date of Surgery Update:  ISAI PETRICCA was seen and examined.  History and physical has been reviewed. The patient has been examined. There have been no significant clinical changes since the completion of the originally dated History and Physical.    Signed By: Asa Saunas, MD     October 02, 2018 9:26 AM             Signed By: Asa Saunas, MD     October 02, 2018

## 2018-10-02 NOTE — Progress Notes (Signed)
Cardiology Progress Note        Patient: Shane Mann        Sex: male          DOA: 10/02/2018  Date of Birth:  05/18/1951      Age:  67 y.o.        LOS:  LOS: 0 days   Assessment/Plan   Date of Surgery Update:  Shane Mann was seen and examined.  History and physical has been reviewed. The patient has been examined. There have been no significant clinical changes since the completion of the originally dated History and Physical.    Signed By: Ajdin Macke, MD     October 02, 2018 9:26 AM             Signed By: Alexzia Kasler, MD     October 02, 2018

## 2018-10-02 NOTE — Progress Notes (Signed)
Cardiology Progress Note        Patient: Shane Mann        Sex: male          DOA: 10/02/2018  Date of Birth:  [birth year]      Age:  68 y.o.        LOS:  LOS: 0 days   Assessment/Plan    cath procedure done.  Angiography reviewed with family.  Discussed with pt.  Complete report to follow    Signed By: Asa Saunas, MD     October 02, 2018

## 2018-10-02 NOTE — Progress Notes (Addendum)
Back from procedure. TR band intact to right radial. Family in. Diet given.   1219 TR band released, sterile 2x2 & Tegaderm applied with armboard, no bleeding or swelling.   1315 discharge instructions reviewed, verbalized understanding.   1330 Discharged home via w/c in stable condition in care of wife. Denies pain. Right arm dressing intact no bleeding or swelling. N/V intact to right arm.

## 2018-10-03 LAB — CARDIAC PROCEDURE
SBP: 126
SBP: 126

## 2018-10-05 DIAGNOSIS — T2612XA Burn of cornea and conjunctival sac, left eye, initial encounter: Secondary | ICD-10-CM

## 2018-10-05 NOTE — ED Notes (Signed)
Patient was spraying roof cleaner at approximately 1630 this evening. Patient has noticed blistering to the LEFT eye, redness and tearing. Patient cannot see out of LEFT eye and states he must have gotten some of the cleaner into his eye. Patient states he flushed his eyes for 10 minutes and used eye drops with no relief.
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
## 2018-10-05 NOTE — ED Triage Notes (Signed)
Patient was spraying roof cleaner at approximately 1630 this evening. Patient has noticed blistering to the LEFT eye, redness and tearing. Patient cannot see out of LEFT eye and states he must have gotten some of the cleaner into his eye. Patient states he flushed his eyes for 10 minutes and used eye drops with no relief.
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
## 2018-10-06 ENCOUNTER — Inpatient Hospital Stay
Admit: 2018-10-06 | Discharge: 2018-10-06 | Disposition: A | Discharge status: Home / Self Care | Payer: MEDICARE | Attending: Emergency Medicine

## 2018-10-06 MED ORDER — CYCLOPENTOLATE 1 % EYE DROPS
1 % | Freq: Three times a day (TID) | OPHTHALMIC | 0 refills | Status: AC
Start: 2018-10-06 — End: ?

## 2018-10-06 MED ORDER — FLUORESCEIN 1 MG EYE STRIPS
1 mg | OPHTHALMIC | Status: AC
Start: 2018-10-06 — End: 2018-10-05
  Administered 2018-10-06: 04:00:00 via OPHTHALMIC

## 2018-10-06 MED ORDER — SODIUM CHLORIDE 0.9 % IV
INTRAVENOUS | Status: DC
Start: 2018-10-06 — End: 2018-10-06
  Administered 2018-10-06: 03:00:00 via OPHTHALMIC

## 2018-10-06 MED ORDER — CIPROFLOXACIN 0.3 % EYE DROPS
0.3 % | OPHTHALMIC | 1 refills | Status: AC
Start: 2018-10-06 — End: 2018-10-16

## 2018-10-06 MED ORDER — PROPARACAINE 0.5 % EYE DROPS
0.5 % | OPHTHALMIC | Status: AC
Start: 2018-10-06 — End: 2018-10-05
  Administered 2018-10-06: 03:00:00 via OPHTHALMIC

## 2018-10-06 MED ORDER — BACITRACIN 500 UNIT/G EYE OINTMENT
500 unit/gram | OPHTHALMIC | 0 refills | Status: AC
Start: 2018-10-06 — End: ?

## 2018-10-06 MED ORDER — CYCLOPENTOLATE 1 % EYE DROPS
1 % | OPHTHALMIC | Status: AC
Start: 2018-10-06 — End: 2018-10-06
  Administered 2018-10-06: 06:00:00 via OPHTHALMIC

## 2018-10-06 MED ORDER — HYDROCODONE-ACETAMINOPHEN 5 MG-325 MG TAB
5-325 mg | ORAL_TABLET | ORAL | 0 refills | Status: AC | PRN
Start: 2018-10-06 — End: 2018-10-13

## 2018-10-06 MED ORDER — CIPROFLOXACIN 0.3 % EYE DROPS
0.3 % | OPHTHALMIC | Status: DC
Start: 2018-10-06 — End: 2018-10-06
  Administered 2018-10-06: 06:00:00 via OPHTHALMIC

## 2018-10-06 MED ORDER — CIPROFLOXACIN 0.3 % EYE OINTMENT
0.3 % | OPHTHALMIC | Status: AC
Start: 2018-10-06 — End: 2018-10-05
  Administered 2018-10-06: 04:00:00 via OPHTHALMIC

## 2018-10-06 MED FILL — CIPROFLOXACIN 0.3 % EYE DROPS: 0.3 % | OPHTHALMIC | Qty: 2.5

## 2018-10-06 MED FILL — CYCLOPENTOLATE 1 % EYE DROPS: 1 % | OPHTHALMIC | Qty: 2

## 2018-10-06 MED FILL — BIOGLO 1 MG EYE STRIPS: 1 mg | OPHTHALMIC | Qty: 2

## 2018-10-06 MED FILL — PROPARACAINE 0.5 % EYE DROPS: 0.5 % | OPHTHALMIC | Qty: 15

## 2018-10-06 MED FILL — SODIUM CHLORIDE 0.9 % IV: INTRAVENOUS | Qty: 1000

## 2018-10-06 MED FILL — CILOXAN 0.3 % EYE OINTMENT: 0.3 % | OPHTHALMIC | Qty: 3.5

## 2018-10-06 NOTE — ED Notes (Signed)
Discharge instructions reviewed with pt, pt verbalizes understanding, discharged in stable condition

## 2019-01-27 ENCOUNTER — Encounter: Payer: Self-pay | Admitting: Emergency Medicine

## 2019-01-27 ENCOUNTER — Other Ambulatory Visit: Payer: Self-pay

## 2019-01-27 ENCOUNTER — Ambulatory Visit
Admission: EM | Admit: 2019-01-27 | Discharge: 2019-01-27 | Disposition: A | Discharge status: Home / Self Care | Payer: Medicare Other | Attending: Family Medicine | Admitting: Family Medicine

## 2019-01-27 DIAGNOSIS — L237 Allergic contact dermatitis due to plants, except food: Secondary | ICD-10-CM | POA: Diagnosis not present

## 2019-01-27 DIAGNOSIS — W57XXXA Bitten or stung by nonvenomous insect and other nonvenomous arthropods, initial encounter: Secondary | ICD-10-CM

## 2019-01-27 HISTORY — DX: Heart disease, unspecified: I51.9

## 2019-01-27 MED ORDER — PREDNISONE 10 MG (21) PO TBPK: ORAL_TABLET | Freq: Every day | ORAL | 0 refills | Status: DC

## 2019-01-27 NOTE — ED Provider Notes (Signed)
[address], [address] [place], [place] [address] [phone]   Name: Andres Young DOB: Jan 17, 1951 MRN: [id number] CSN: [id number] PCP: Patient, No Pcp Per  Arrival date and time:  01/27/19 1224  Chief Complaint:  Rash  NOTE: Prior to seeing the patient today, I have reviewed the triage nursing documentation and vital signs. Clinical staff has updated patient's PMH/PSHx, current medication list, and drug allergies/intolerances to ensure comprehensive history available to assist in medical decision making.   History:   HPI: Andres Young is a 68 y.o. male who presents today with complaints of being bitten by a tick. Patient notes that he removed a tick from his lower back 2 weeks ago. He is unsure what type of tick it was that bit him. He is unsure how long the tick was embedded. The tick was was not noted to be engorged at the time it was removed by the patient. Patient notes that the tick was removed fully intact. Since the insect was removed from the patient, he notes that he has experienced any of the common symptoms associated with known tick bites. 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  Patient notes that his symptoms are baseline and have not increased following him being bitten by tick. He has not appreciated an associated rash at the site where he was bitten, however there is an evident site marked noted.  Additionally, patient complaining of a rash to his BILATERAL wrists, thighs, hands, and anterior chest wall.  Patient notes that he and his wife moved to [place] from [place] about 6 weeks ago and they have been doing a lot of work in and around an old home.  Patient notes exposure to poison oak that he has been trying to conservatively treat at home using over-the-counter topical preparations, which have been minimally effective.  Patient notes that the rash continues to  spread despite best efforts to control at home.  Past Medical History:  Diagnosis Date   Heart disease     Past Surgical History:  Procedure Laterality Date   CORONARY ANGIOPLASTY WITH STENT PLACEMENT     EYE SURGERY      Family History  Problem Relation Age of Onset   Heart disease Mother    Kidney disease Father     Social History   Socioeconomic History   Marital status: Married    Spouse name: Not on file   Number of children: Not on file   Years of education: Not on file   Highest education level: Not on file  Occupational History   Not on file  Social Needs   Financial resource strain: Not on file   Food insecurity:    Worry: Not on file    Inability: Not on file   Transportation needs:    Medical: Not on file    Non-medical: Not on file  Tobacco Use   Smoking status: Former Smoker   Smokeless tobacco: Never Used  Substance and Sexual Activity   Alcohol use: Not Currently   Drug use: Not Currently   Sexual activity: Not on file  Lifestyle   Physical activity:    Days per week: Not on file    Minutes per session: Not on file   Stress: Not on file  Relationships   Social connections:    Talks on phone: Not on file    Gets together: Not on file    Attends religious service: Not on  file    Active member of club or organization: Not on file    Attends meetings of clubs or organizations: Not on file    Relationship status: Not on file   Intimate partner violence:    Fear of current or ex partner: Not on file    Emotionally abused: Not on file    Physically abused: Not on file    Forced sexual activity: Not on file  Other Topics Concern   Not on file  Social History Narrative   Not on file    There are no active problems to display for this patient.   Home Medications:    No outpatient medications have been marked as taking for the 01/27/19 encounter [facility] Encounter).    Allergies:   Patient has no known  allergies.  Review of Systems (ROS): Review of Systems  Constitutional: Positive for fatigue. Negative for chills and fever.  Respiratory: Negative for cough and shortness of breath.   Cardiovascular: Negative for chest pain and palpitations.  Gastrointestinal: Negative for abdominal pain, diarrhea, nausea and vomiting.  Musculoskeletal: Positive for arthralgias (Chronic) and myalgias (Chronic).       PMH (+) for Lonia Blood syndrome  Skin: Positive for rash.  Neurological: Positive for headaches (Chronic). Negative for weakness.     Physical Exam:  Triage Vital Signs ED Triage Vitals  Enc Vitals Group     BP 01/27/19 1254 (!) 141/93     Pulse Rate 01/27/19 1254 73     Resp 01/27/19 1254 18     Temp 01/27/19 1254 97.8 F (36.6 C)     Temp Source 01/27/19 1254 Oral     SpO2 01/27/19 1254 96 %     Weight 01/27/19 1249 227 lb (103 kg)     Height 01/27/19 1249 5\' 11"  (1.803 m)     Head Circumference --      Peak Flow --      Pain Score 01/27/19 1248 0     Pain Loc --      Pain Edu? --      Excl. in [place]? --     Physical Exam  Constitutional: He is oriented to person, place, and time and well-developed, well-nourished, and in no distress.  HENT:  Head: Normocephalic and atraumatic.  Mouth/Throat: Oropharynx is clear and moist and mucous membranes are normal.  Eyes: Pupils are equal, round, and reactive to light. EOM are normal.  Neck: Normal range of motion. Neck supple. No tracheal deviation present.  Cardiovascular: Normal rate, regular rhythm, normal heart sounds and intact distal pulses. Exam reveals no gallop and no friction rub.  No murmur heard. Pulmonary/Chest: Effort normal and breath sounds normal. No respiratory distress. He has no wheezes. He has no rales.  Lymphadenopathy:    He has no cervical adenopathy.  Neurological: He is alert and oriented to person, place, and time.  Skin: Skin is warm and dry. Rash noted. Rash is maculopapular (diffusely distributed  rash to BILATERAL hands, wrists, thighs, and anterior chest; pruritic). No erythema.     Psychiatric: Mood, affect and judgment normal.  Nursing note and vitals reviewed.    Urgent Care Treatments / Results:   LABS: PLEASE NOTE: all labs that were ordered this encounter are listed, however only abnormal results are displayed. Labs Reviewed - No data to display  EKG: -None  RADIOLOGY: No results found.  PROCEDURES: Procedures  MEDICATIONS RECEIVED THIS VISIT: Medications - No data to display  PERTINENT CLINICAL COURSE NOTES/UPDATES:  Initial Impression / Assessment and Plan / Urgent Care Course:    Andres Young is a 68 y.o. male who [facility] today with complaints of Rash  Pertinent labs & imaging results that were available during my care of the patient were personally reviewed by me and considered in my medical decision making (see lab/imaging section of note for values and interpretations).  Patient overall well appearing and in no acute distress today in clinic. Exam reveals diffusely distributed rash to patient's body.  He endorses known exposure to poison oak.  Rash progressive over the course of the last 2 weeks despite conservative treatment at home.  Will treat with a systemic course of steroids (prednisone).  Patient encouraged to utilize diphenhydramine as needed for itching.  Recommended calamine lotion to soothe skin and also help with itching.  Regarding tick bite to lower back.  Insect was removed fully intact and was not engorged.  Area is minimally erythematous.  No associated erythema migrans.  Patient denies any LAD.  He notes chronic nonspecific headaches, myalgias, arthralgias, and fatigue associated with his Andres Young diagnosis.  Symptoms are at baseline; no increase in symptoms since he was bitten by tick.  Educated on treatment guidelines regarding tickborne illnesses.  Discussed low suspicion for tickborne illness at this time.   Patient encouraged to monitor site for progressive symptoms.  Discussed follow up with primary care physician in 1 week for re-evaluation. I have reviewed the follow up and strict return precautions for any new or worsening symptoms. Patient is aware of symptoms that would be deemed urgent/emergent, and would thus require further evaluation either here or in the emergency department. At the time of discharge, he verbalized understanding and consent with the discharge plan as it was reviewed with him. All questions were fielded by provider and/or clinic staff prior to patient discharge.    Final Clinical Impressions(s) / Urgent Care Diagnoses:   Final diagnoses:  Poison oak dermatitis  Tick bite, initial encounter    New Prescriptions:   Meds ordered this encounter  Medications   predniSONE (STERAPRED UNI-PAK 21 TAB) 10 MG (21) TBPK tablet    Sig: Take by mouth daily. Take 6 pills (60mg ) x 1 day, 5 pills (50mg ) x 1 day, 4 pills (40mg ) x 1 day, 3 pills (30mg ) x 1 day, 2 pills (20mg ) x 1 day, 1 pill (10mg ) x 1 day.    Dispense:  21 tablet    Refill:  0    Controlled Substance Prescriptions:  [place] Controlled Substance Registry consulted? Not Applicable  NOTE: This note was prepared using Dragon dictation software along with smaller phrase technology. Despite my best ability to proofread, there is the potential that transcriptional errors may still occur from this process, and are completely unintentional.     Verlee MonteGray, Bronte Sabado E, NP 01/27/19 [phone]

## 2019-01-27 NOTE — Discharge Instructions (Addendum)
It was very nice seeing you today in clinic. Thank you for entrusting me with your care.   The tick bite area looks good. No cause for concern at this time. Please utilize the medications that we discussed. Your prescriptions have been called in to your pharmacy.   Make arrangements to follow up with your PCP in 1 week for re-evaluation if needed. If your symptoms/condition worsens, please seek follow up care either here or in the ER. Please remember, our [facility] providers are "right here with you" when you need [place].   Again, it was my pleasure to take care of you today. Thank you for choosing our clinic. I hope that you start to feel better quickly.   Honor Loh, MSN, APRN, FNP-C, CEN Advanced Practice Provider [facility]

## 2019-01-27 NOTE — ED Triage Notes (Addendum)
Pt c/o rash on bilateral wrist, thigh, and chest. He has h/o getting poison oak and having throat tightness. He also has a tick bite on his back. Rash started about 2 weeks ago.   Pt did not have medications with him today and did not know them, tried to call his wife but could not reach her to get them.

## 2019-08-27 ENCOUNTER — Other Ambulatory Visit: Payer: Self-pay

## 2019-08-27 ENCOUNTER — Encounter: Payer: Self-pay | Admitting: Emergency Medicine

## 2019-08-27 ENCOUNTER — Ambulatory Visit
Admission: EM | Admit: 2019-08-27 | Discharge: 2019-08-27 | Disposition: A | Discharge status: Home / Self Care | Payer: Medicare Other | Attending: Family Medicine | Admitting: Family Medicine

## 2019-08-27 ENCOUNTER — Ambulatory Visit: Payer: Medicare Other

## 2019-08-27 DIAGNOSIS — Z955 Presence of coronary angioplasty implant and graft: Secondary | ICD-10-CM | POA: Insufficient documentation

## 2019-08-27 DIAGNOSIS — Z87891 Personal history of nicotine dependence: Secondary | ICD-10-CM | POA: Insufficient documentation

## 2019-08-27 DIAGNOSIS — Z8249 Family history of ischemic heart disease and other diseases of the circulatory system: Secondary | ICD-10-CM | POA: Diagnosis not present

## 2019-08-27 DIAGNOSIS — G6181 Chronic inflammatory demyelinating polyneuritis: Secondary | ICD-10-CM | POA: Diagnosis not present

## 2019-08-27 DIAGNOSIS — J189 Pneumonia, unspecified organism: Secondary | ICD-10-CM

## 2019-08-27 DIAGNOSIS — R197 Diarrhea, unspecified: Secondary | ICD-10-CM | POA: Insufficient documentation

## 2019-08-27 DIAGNOSIS — R5383 Other fatigue: Secondary | ICD-10-CM | POA: Insufficient documentation

## 2019-08-27 DIAGNOSIS — Z841 Family history of disorders of kidney and ureter: Secondary | ICD-10-CM | POA: Diagnosis not present

## 2019-08-27 DIAGNOSIS — Z79899 Other long term (current) drug therapy: Secondary | ICD-10-CM | POA: Insufficient documentation

## 2019-08-27 DIAGNOSIS — J1282 Pneumonia due to coronavirus disease 2019: Secondary | ICD-10-CM | POA: Insufficient documentation

## 2019-08-27 DIAGNOSIS — Z7982 Long term (current) use of aspirin: Secondary | ICD-10-CM | POA: Diagnosis not present

## 2019-08-27 DIAGNOSIS — M109 Gout, unspecified: Secondary | ICD-10-CM | POA: Insufficient documentation

## 2019-08-27 DIAGNOSIS — Z7952 Long term (current) use of systemic steroids: Secondary | ICD-10-CM | POA: Diagnosis not present

## 2019-08-27 DIAGNOSIS — U071 COVID-19: Secondary | ICD-10-CM | POA: Insufficient documentation

## 2019-08-27 DIAGNOSIS — I252 Old myocardial infarction: Secondary | ICD-10-CM | POA: Diagnosis not present

## 2019-08-27 DIAGNOSIS — N400 Enlarged prostate without lower urinary tract symptoms: Secondary | ICD-10-CM | POA: Diagnosis not present

## 2019-08-27 DIAGNOSIS — Z87442 Personal history of urinary calculi: Secondary | ICD-10-CM | POA: Diagnosis not present

## 2019-08-27 DIAGNOSIS — R112 Nausea with vomiting, unspecified: Secondary | ICD-10-CM | POA: Insufficient documentation

## 2019-08-27 HISTORY — DX: Acute myocardial infarction, unspecified: I21.9

## 2019-08-27 HISTORY — DX: Benign prostatic hyperplasia without lower urinary tract symptoms: N40.0

## 2019-08-27 HISTORY — DX: Calculus of kidney: N20.0

## 2019-08-27 HISTORY — DX: Guillain-Barre syndrome: G61.0

## 2019-08-27 HISTORY — DX: Other gastritis without bleeding: K29.60

## 2019-08-27 HISTORY — DX: Unspecified cataract: H26.9

## 2019-08-27 HISTORY — DX: Gout, unspecified: M10.9

## 2019-08-27 HISTORY — DX: Chronic inflammatory demyelinating polyneuritis: G61.81

## 2019-08-27 LAB — INFLUENZA PANEL BY PCR (TYPE A & B)
Influenza A By PCR: NEGATIVE
Influenza B By PCR: NEGATIVE

## 2019-08-27 MED ORDER — DOXYCYCLINE HYCLATE 100 MG PO CAPS: 100.0000 mg | ORAL_CAPSULE | Freq: Two times a day (BID) | ORAL | 0 refills | Status: DC

## 2019-08-27 MED ORDER — AMOXICILLIN-POT CLAVULANATE ER 1000-62.5 MG PO TB12: 2.0000 | ORAL_TABLET | Freq: Two times a day (BID) | ORAL | 0 refills | Status: AC

## 2019-08-27 NOTE — ED Provider Notes (Signed)
[facility]    CSN: [id number] Arrival date & time: 08/27/19  1141      History   Chief Complaint Chief Complaint  Patient presents with  . Fatigue  . Fever  . Cough   HPI  69 year old male presents with the above complaints.  Patient reports his symptoms started on Saturday.  He reports fatigue, sore throat, cough, shortness of breath, loss of appetite.  He has had a fever as high as 102.  He feels very poorly.  Severe fatigue.  He also reports that he has had some nausea and vomiting as well as diarrhea.  No reported exposures to COVID-19.  Currently afebrile.  No known exacerbating or relieving factors.  Symptoms are severe.  No other complaints.  PMH, Surgical Hx, Family Hx, Social History reviewed and updated as below.  Past Medical History:  Diagnosis Date  . Cataract   . CIDP (chronic inflammatory demyelinating polyneuropathy) ([place])   . Gout   . Guillain Barr syndrome ([place])   . Heart attack ([place])   . Heart disease   . Kidney stone   . Prostate enlargement   . Reflux gastritis    Past Surgical History:  Procedure Laterality Date  . CORONARY ANGIOPLASTY WITH STENT PLACEMENT    . EYE SURGERY     Home Medications    Prior to Admission medications   Medication Sig Start Date End Date Taking? Authorizing Provider  allopurinol (ZYLOPRIM) 100 MG tablet Take 100 mg by mouth 2 (two) times daily.   Yes [provider]  aspirin 81 MG chewable tablet Chew by mouth daily.   Yes [provider]  atorvastatin (LIPITOR) 40 MG tablet Take 40 mg by mouth daily.   Yes [provider]  calcium-vitamin D (OSCAL WITH D) 500-200 MG-UNIT tablet Take 1 tablet by mouth.   Yes [provider]  clopidogrel (PLAVIX) 75 MG tablet Take 75 mg by mouth daily.   Yes [provider]  colchicine 0.6 MG tablet Take 0.6 mg by mouth daily.   Yes [provider]  diltiazem (CARDIZEM) 120 MG tablet Take 120 mg by mouth daily.   Yes  [provider]  DULoxetine (CYMBALTA) 30 MG capsule Take 30 mg by mouth 3 (three) times daily.   Yes [provider]  dutasteride (AVODART) 0.5 MG capsule Take 0.5 mg by mouth daily.   Yes [provider]  gabapentin (NEURONTIN) 300 MG capsule Take 300 mg by mouth 3 (three) times daily.   Yes [provider]  lansoprazole (PREVACID) 30 MG capsule Take 30 mg by mouth daily at 12 noon.   Yes [provider]  vitamin B-12 (CYANOCOBALAMIN) 100 MCG tablet Take 100 mcg by mouth daily.   Yes [provider]  amoxicillin-clavulanate (AUGMENTIN XR) 1000-62.5 MG 12 hr tablet Take 2 tablets by mouth 2 (two) times daily for 7 days. 08/27/19 09/03/19  Coral Spikes, DO  doxycycline (VIBRAMYCIN) 100 MG capsule Take 1 capsule (100 mg total) by mouth 2 (two) times daily. 08/27/19   Coral Spikes, DO  predniSONE (STERAPRED UNI-PAK 21 TAB) 10 MG (21) TBPK tablet Take by mouth daily. Take 6 pills (60mg ) x 1 day, 5 pills (50mg ) x 1 day, 4 pills (40mg ) x 1 day, 3 pills (30mg ) x 1 day, 2 pills (20mg ) x 1 day, 1 pill (10mg ) x 1 day. 01/27/19   Karen Kitchens, NP    Family History Family History  Problem Relation Age of Onset  .  Heart disease Mother   . Kidney disease Father     Social History Social History   Tobacco Use  . Smoking status: Former Games developer  . Smokeless tobacco: Never Used  Substance Use Topics  . Alcohol use: Not Currently  . Drug use: Not Currently     Allergies   Patient has no known allergies.   Review of Systems Review of Systems  Constitutional: Positive for fatigue and fever.  HENT: Positive for sore throat.   Respiratory: Positive for cough and shortness of breath.   Gastrointestinal: Positive for diarrhea, nausea and vomiting.  All other systems reviewed and are negative.  Physical Exam Triage Vital Signs ED Triage Vitals  Enc Vitals Group     BP 08/27/19 1200 140/76     Pulse Rate 08/27/19 1200 70     Resp 08/27/19 1200 18      Temp 08/27/19 1200 97.8 F (36.6 C)     Temp Source 08/27/19 1200 Oral     SpO2 08/27/19 1200 96 %     Weight 08/27/19 1203 223 lb (101.2 kg)     Height 08/27/19 1203 5\' 11"  (1.803 m)     Head Circumference --      Peak Flow --      Pain Score 08/27/19 1201 0     Pain Loc --      Pain Edu? --      Excl. in [place]? --    Updated Vital Signs BP 140/76 (BP Location: Right Arm)   Pulse 70   Temp 97.8 F (36.6 C) (Oral)   Resp 18   Ht 5\' 11"  (1.803 m)   Wt 101.2 kg   SpO2 96%   BMI 31.10 kg/m   Visual Acuity Right Eye Distance:   Left Eye Distance:   Bilateral Distance:    Right Eye Near:   Left Eye Near:    Bilateral Near:     Physical Exam Vitals and nursing note reviewed.  Constitutional:      Appearance: He is not toxic-appearing.     Comments: Appears fatigued but in no acute distress.  HENT:     Head: Normocephalic and atraumatic.     Nose: Nose normal.  Eyes:     General:        Right eye: No discharge.        Left eye: No discharge.     Conjunctiva/sclera: Conjunctivae normal.  Cardiovascular:     Rate and Rhythm: Normal rate and regular rhythm.     Heart sounds: No murmur.  Pulmonary:     Effort: Pulmonary effort is normal.     Breath sounds: Rales present. No wheezing or rhonchi.  Skin:    General: Skin is warm.     Findings: No rash.  Neurological:     Mental Status: He is alert.  Psychiatric:        Mood and Affect: Mood normal.        Behavior: Behavior normal.    UC Treatments / Results  Labs (all labs ordered are listed, but only abnormal results are displayed) Labs Reviewed  NOVEL CORONAVIRUS, NAA (HOSP ORDER, SEND-OUT TO REF LAB; TAT 18-24 HRS)  INFLUENZA PANEL BY PCR (TYPE A & B)    EKG   Radiology DG Chest 2 View  Result Date: 08/27/2019 CLINICAL DATA:  Cough, fever EXAM: CHEST - 2 VIEW COMPARISON:  None. FINDINGS: Patchy right mid lung density. Probable calcified granuloma of the left lower lung. No  pleural effusion.  Cardiomediastinal contours are within normal limits. IMPRESSION: Patchy right mid lung density suspicious for pneumonia. Electronically Signed   By: Guadlupe Spanish M.D.   On: 08/27/2019 13:17    Procedures Procedures (including critical care time)  Medications Ordered in UC Medications - No data to display  Initial Impression / Assessment and Plan / UC Course  I have reviewed the triage vital signs and the nursing notes.  Pertinent labs & imaging results that were available during my care of the patient were reviewed by me and considered in my medical decision making (see chart for details).    69 year old male presents with the above symptoms.  X-ray revealed findings consistent with right middle lobe pneumonia.  Awaiting Covid test result.  Placing on Augmentin and doxycycline.  Azithromycin was not used due to interactions with one of his medications.  Final Clinical Impressions(s) / UC Diagnoses   Final diagnoses:  Community acquired pneumonia of right middle lobe of lung   Discharge Instructions   None    ED Prescriptions    Medication Sig Dispense Auth. Provider   amoxicillin-clavulanate (AUGMENTIN XR) 1000-62.5 MG 12 hr tablet Take 2 tablets by mouth 2 (two) times daily for 7 days. 28 tablet Shweta Aman G, DO   doxycycline (VIBRAMYCIN) 100 MG capsule Take 1 capsule (100 mg total) by mouth 2 (two) times daily. 14 capsule Everlene Other G, DO     PDMP not reviewed this encounter.   Tommie Sams, [place] 08/27/19 1804

## 2019-08-27 NOTE — ED Triage Notes (Signed)
Patient c/o fatigue, sore throat, cough, shortness of breath, loss of appetite that started on Saturday. Monday night had a temp of 102.4.

## 2019-08-30 DIAGNOSIS — J1282 Pneumonia due to coronavirus disease 2019: Secondary | ICD-10-CM | POA: Insufficient documentation

## 2019-08-30 DIAGNOSIS — G6181 Chronic inflammatory demyelinating polyneuritis: Secondary | ICD-10-CM | POA: Insufficient documentation

## 2019-08-30 DIAGNOSIS — E6609 Other obesity due to excess calories: Secondary | ICD-10-CM | POA: Insufficient documentation

## 2019-08-30 DIAGNOSIS — M1A9XX0 Chronic gout, unspecified, without tophus (tophi): Secondary | ICD-10-CM | POA: Insufficient documentation

## 2019-08-30 DIAGNOSIS — I25119 Atherosclerotic heart disease of native coronary artery with unspecified angina pectoris: Secondary | ICD-10-CM | POA: Insufficient documentation

## 2019-08-30 DIAGNOSIS — E782 Mixed hyperlipidemia: Secondary | ICD-10-CM | POA: Insufficient documentation

## 2019-08-30 DIAGNOSIS — J9601 Acute respiratory failure with hypoxia: Secondary | ICD-10-CM | POA: Insufficient documentation

## 2019-08-30 DIAGNOSIS — G4733 Obstructive sleep apnea (adult) (pediatric): Secondary | ICD-10-CM | POA: Insufficient documentation

## 2019-08-31 ENCOUNTER — Telehealth (HOSPITAL_COMMUNITY): Payer: Self-pay | Admitting: Emergency Medicine

## 2019-08-31 NOTE — Telephone Encounter (Signed)
Your test for COVID-19 was positive, meaning that you were infected with the novel coronavirus and could give the germ to others.  Please continue isolation at home for at least 10 days since the start of your symptoms. If you do not have symptoms, please isolate at home for 10 days from the day you were tested. Once you complete your 10 day quarantine, you may return to normal activities as long as you've not had a fever for over 24 hours(without taking fever reducing medicine) and your symptoms are improving. Please continue good preventive care measures, including:  frequent hand-washing, avoid touching your face, cover coughs/sneezes, stay out of crowds and keep a 6 foot distance from others.  Go to the nearest hospital emergency room if fever/cough/breathlessness are severe or illness seems like a threat to life.  Pt wife stated he is currently admitted in the hospital

## 2019-09-02 LAB — NOVEL CORONAVIRUS, NAA (HOSP ORDER, SEND-OUT TO REF LAB; TAT 18-24 HRS): SARS-CoV-2, NAA: DETECTED — AB

## 2019-09-24 DIAGNOSIS — I10 Essential (primary) hypertension: Secondary | ICD-10-CM | POA: Insufficient documentation

## 2019-09-24 DIAGNOSIS — I252 Old myocardial infarction: Secondary | ICD-10-CM | POA: Insufficient documentation

## 2019-09-24 DIAGNOSIS — F4310 Post-traumatic stress disorder, unspecified: Secondary | ICD-10-CM | POA: Insufficient documentation

## 2019-09-24 DIAGNOSIS — R351 Nocturia: Secondary | ICD-10-CM | POA: Insufficient documentation

## 2019-09-24 DIAGNOSIS — Z8669 Personal history of other diseases of the nervous system and sense organs: Secondary | ICD-10-CM | POA: Insufficient documentation

## 2019-10-13 DIAGNOSIS — R0602 Shortness of breath: Secondary | ICD-10-CM | POA: Insufficient documentation

## 2019-10-29 ENCOUNTER — Ambulatory Visit: Payer: Medicare Other | Admitting: Urology

## 2019-10-30 ENCOUNTER — Ambulatory Visit (INDEPENDENT_AMBULATORY_CARE_PROVIDER_SITE_OTHER): Payer: Medicare Other | Admitting: Urology

## 2019-10-30 ENCOUNTER — Other Ambulatory Visit: Payer: Self-pay

## 2019-10-30 ENCOUNTER — Encounter: Payer: Self-pay | Admitting: Urology

## 2019-10-30 VITALS — BP 156/81 | HR 86 | Ht 71.0 in | Wt 216.0 lb

## 2019-10-30 DIAGNOSIS — R310 Gross hematuria: Secondary | ICD-10-CM | POA: Diagnosis not present

## 2019-10-30 DIAGNOSIS — N400 Enlarged prostate without lower urinary tract symptoms: Secondary | ICD-10-CM

## 2019-10-30 DIAGNOSIS — R972 Elevated prostate specific antigen [PSA]: Secondary | ICD-10-CM | POA: Diagnosis not present

## 2019-10-30 LAB — MICROSCOPIC EXAMINATION: Bacteria, UA: NONE SEEN

## 2019-10-30 LAB — BLADDER SCAN AMB NON-IMAGING: Scan Result: 12

## 2019-10-30 LAB — URINALYSIS, COMPLETE
Bilirubin, UA: NEGATIVE
Glucose, UA: NEGATIVE
Nitrite, UA: NEGATIVE
Specific Gravity, UA: >1.03 — ABNORMAL HIGH (ref 1.005–1.030)
Urobilinogen, Ur: 1 mg/dL (ref 0.2–1.0)
pH, UA: 5 (ref 5.0–7.5)

## 2019-10-30 NOTE — Progress Notes (Signed)
10/30/2019 2:43 PM   Andres Young Jun 07, 1951 [id number]  Referring provider: Su Monks, PA [address] [place],  [place] [address]  Chief Complaint  Patient presents with  . Benign Prostatic Hypertrophy    HPI: Andres Young is a 69 y.o. male seen at the request of Andres Young, [place] for evaluation of BPH.  -Relocated to the area from [place], [place] June 2020;  -Followed by local urologist in the area for BPH -no prior urology records for review -On dutasteride  -2018 began to have transient PSA elevation -Benign prostate biopsy -States developed significant gross hematuria December 2019.  Fairly certain had CT and cystoscopy -Was scheduled to undergo a prostate procedure for enlargement however was canceled with COVID-19 -Episode gross hematuria last week and earlier this week with reddish-brown urine -Baseline voiding symptoms intermittency, weak stream, nocturia x4, sensation of incomplete emptying -IPSS today 27/35; QoL 6/6   PMH: Past Medical History:  Diagnosis Date  . Cataract   . CIDP (chronic inflammatory demyelinating polyneuropathy) ([facility])   . Gout   . Guillain Barr syndrome ([facility])   . Heart attack ([facility])   . Heart disease   . Kidney stone   . Prostate enlargement   . Reflux gastritis     Surgical History: Past Surgical History:  Procedure Laterality Date  . CORONARY ANGIOPLASTY WITH STENT PLACEMENT    . EYE SURGERY      Home Medications:  Allergies as of 10/30/2019   No Known Allergies     Medication List       Accurate as of October 30, 2019  2:43 PM. If you have any questions, ask your nurse or doctor.        allopurinol 100 MG tablet Commonly known as: ZYLOPRIM Take 100 mg by mouth 2 (two) times daily.   aspirin 81 MG chewable tablet Chew by mouth daily.   atorvastatin 40 MG tablet Commonly known as: LIPITOR Take 40 mg by mouth daily.   calcium-vitamin D 500-200 MG-UNIT tablet Commonly known as: OSCAL WITH  D Take 1 tablet by mouth.   clopidogrel 75 MG tablet Commonly known as: PLAVIX Take 75 mg by mouth daily.   colchicine 0.6 MG tablet Take 0.6 mg by mouth daily.   diltiazem 120 MG tablet Commonly known as: CARDIZEM Take 120 mg by mouth daily.   doxycycline 100 MG capsule Commonly known as: VIBRAMYCIN Take 1 capsule (100 mg total) by mouth 2 (two) times daily.   DULoxetine 30 MG capsule Commonly known as: CYMBALTA Take 30 mg by mouth 3 (three) times daily.   dutasteride 0.5 MG capsule Commonly known as: AVODART Take 0.5 mg by mouth daily.   gabapentin 300 MG capsule Commonly known as: NEURONTIN Take 300 mg by mouth 3 (three) times daily.   hydrOXYzine 25 MG tablet Commonly known as: ATARAX/VISTARIL Take by mouth.   lansoprazole 30 MG capsule Commonly known as: PREVACID Take 30 mg by mouth daily at 12 noon.   predniSONE 10 MG (21) Tbpk tablet Commonly known as: STERAPRED UNI-PAK 21 TAB Take by mouth daily. Take 6 pills (60mg ) x 1 day, 5 pills (50mg ) x 1 day, 4 pills (40mg ) x 1 day, 3 pills (30mg ) x 1 day, 2 pills (20mg ) x 1 day, 1 pill (10mg ) x 1 day.   vitamin B-12 100 MCG tablet Commonly known as: CYANOCOBALAMIN Take 100 mcg by mouth daily.       Allergies: No Known Allergies  Family History: Family History  Problem  Relation Age of Onset  . Heart disease Mother   . Kidney disease Father     Social History:  reports that he has quit smoking. He has never used smokeless tobacco. He reports previous alcohol use. He reports previous drug use.   Physical Exam: BP (!) 156/81   Pulse 86   Ht 5\' 11"  (1.803 m)   Wt 216 lb (98 kg)   BMI 30.13 kg/m   Constitutional:  Alert and oriented, No acute distress. HEENT: [place] AT, moist mucus membranes.  Trachea midline, no masses. Cardiovascular: No clubbing, cyanosis, or edema. Respiratory: Normal respiratory effort, no increased work of breathing. GI: Abdomen is soft, nontender, nondistended, no abdominal  masses GU: Prostate mildly enlarged rectally ~35 g, smooth without nodules Skin: No rashes, bruises or suspicious lesions. Neurologic: Grossly intact, no focal deficits, moving all 4 extremities. Psychiatric: Normal mood and affect.  Laboratory Data:  Urinalysis Dipstick 2+ blood/trace leukocytes Microscopy 0-5 WBC/3-10 RBC/calcium oxalate crystals  Assessment & Plan:    -BPH with LUTS Severe lower urinary tract symptoms.  PVR by bladder scan today was 12 mL.  We will obtain his prior urology records for review.  He is interested in pursuing outlet surgery.  - Gross hematuria Secondary to BPH by history.  Record review as above.  -Elevated PSA Benign prostate biopsy by history   Abbie Sons, MD  [facility] [address], [place], [place] [address] [phone]

## 2019-11-01 ENCOUNTER — Encounter: Payer: Self-pay | Admitting: Urology

## 2019-11-01 DIAGNOSIS — N400 Enlarged prostate without lower urinary tract symptoms: Secondary | ICD-10-CM | POA: Insufficient documentation

## 2019-11-01 DIAGNOSIS — R310 Gross hematuria: Secondary | ICD-10-CM | POA: Insufficient documentation

## 2019-11-01 DIAGNOSIS — R972 Elevated prostate specific antigen [PSA]: Secondary | ICD-10-CM | POA: Insufficient documentation

## 2019-11-04 DIAGNOSIS — G629 Polyneuropathy, unspecified: Secondary | ICD-10-CM | POA: Insufficient documentation

## 2019-11-05 ENCOUNTER — Other Ambulatory Visit: Payer: Self-pay | Admitting: Acute Care

## 2019-11-05 DIAGNOSIS — Z8669 Personal history of other diseases of the nervous system and sense organs: Secondary | ICD-10-CM

## 2019-11-17 ENCOUNTER — Ambulatory Visit
Admission: RE | Admit: 2019-11-17 | Discharge: 2019-11-17 | Disposition: A | Discharge status: Home / Self Care | Payer: Medicare Other | Source: Ambulatory Visit | Attending: Acute Care | Admitting: Acute Care

## 2019-11-17 ENCOUNTER — Other Ambulatory Visit: Payer: Self-pay

## 2019-11-17 DIAGNOSIS — Z8669 Personal history of other diseases of the nervous system and sense organs: Secondary | ICD-10-CM | POA: Insufficient documentation

## 2019-12-16 DIAGNOSIS — R296 Repeated falls: Secondary | ICD-10-CM | POA: Insufficient documentation

## 2019-12-16 DIAGNOSIS — G5603 Carpal tunnel syndrome, bilateral upper limbs: Secondary | ICD-10-CM | POA: Insufficient documentation

## 2019-12-16 DIAGNOSIS — R519 Headache, unspecified: Secondary | ICD-10-CM | POA: Insufficient documentation

## 2019-12-16 DIAGNOSIS — R251 Tremor, unspecified: Secondary | ICD-10-CM | POA: Insufficient documentation

## 2019-12-16 DIAGNOSIS — M21372 Foot drop, left foot: Secondary | ICD-10-CM | POA: Insufficient documentation

## 2019-12-29 DIAGNOSIS — M545 Low back pain, unspecified: Secondary | ICD-10-CM | POA: Insufficient documentation

## 2019-12-29 DIAGNOSIS — R29898 Other symptoms and signs involving the musculoskeletal system: Secondary | ICD-10-CM | POA: Insufficient documentation

## 2019-12-29 DIAGNOSIS — R200 Anesthesia of skin: Secondary | ICD-10-CM | POA: Insufficient documentation

## 2020-01-21 ENCOUNTER — Encounter: Payer: Self-pay | Admitting: Physical Therapy

## 2020-01-21 ENCOUNTER — Ambulatory Visit: Payer: Medicare Other | Attending: Neurology | Admitting: Physical Therapy

## 2020-01-21 ENCOUNTER — Other Ambulatory Visit: Payer: Self-pay

## 2020-01-21 DIAGNOSIS — M2560 Stiffness of unspecified joint, not elsewhere classified: Secondary | ICD-10-CM | POA: Diagnosis present

## 2020-01-21 DIAGNOSIS — R269 Unspecified abnormalities of gait and mobility: Secondary | ICD-10-CM

## 2020-01-21 DIAGNOSIS — M6281 Muscle weakness (generalized): Secondary | ICD-10-CM | POA: Diagnosis present

## 2020-01-21 DIAGNOSIS — M21372 Foot drop, left foot: Secondary | ICD-10-CM | POA: Insufficient documentation

## 2020-01-21 DIAGNOSIS — R2689 Other abnormalities of gait and mobility: Secondary | ICD-10-CM | POA: Diagnosis present

## 2020-01-21 NOTE — Patient Instructions (Signed)
Access Code: [email or web address]: [email or web address]: 06/03/2021Prepared by: Casimiro Needle SherkExercises  Long Sitting Calf Stretch with Strap - 2 x daily - 7 x weekly - 1 sets - 3 reps - 30 seconds hold  Standing Gastroc Stretch on Step with Counter Support - 2 x daily - 7 x weekly - 1 sets - 3 reps - 20 seconds hold  Standing Knee Flexion Stretch on Step - 2 x daily - 7 x weekly - 1 sets - 3 reps - 20 seconds hold  Standing Hamstring Stretch with Step - 2 x daily - 7 x weekly - 1 sets - 3 reps - 20 seconds hold  Ankle Alphabet in Elevation - 1 x daily - 7 x weekly - 1 sets - 2 reps  Supine Bridge - 1 x daily - 7 x weekly - 1 sets - 15 reps

## 2020-01-22 NOTE — Therapy (Addendum)
[facility] [address] [place], [place], [address] Phone: [phone]   Fax:  [phone]  Physical Therapy Evaluation  Patient Details  Name: Andres Young MRN: [id number] Date of Birth: April 22, 1951 Referring Provider (PT): Dr. Malvin Johns   Encounter Date: 01/21/2020    PT End of Session - 01/22/20 1316    Visit Number  1    Number of Visits  9    Date for PT Re-Evaluation  02/18/20    Authorization - Visit Number  1    Authorization - Number of Visits  10    PT Start Time  1026    PT Stop Time  1120    PT Time Calculation (min)  54 min    Activity Tolerance  Patient tolerated treatment well        Past Medical History:  Diagnosis Date  . Cataract   . CIDP (chronic inflammatory demyelinating polyneuropathy) (HCC)   . Gout   . Guillain Barr syndrome (HCC)   . Heart attack (HCC)   . Heart disease   . Kidney stone   . Prostate enlargement   . Reflux gastritis     Past Surgical History:  Procedure Laterality Date  . CORONARY ANGIOPLASTY WITH STENT PLACEMENT    . EYE SURGERY      There were no vitals filed for this visit.    Subjective Assessment - 02/04/20 1238    Subjective Pt. referred to PT secondary to L foot drop/ balance issues.  Pt. c/o postural movement tremors in B hands.  Pt. has a complex PMHx (see notes).  Pts. wife reports a marked decrease in cognitive function since (+) Covid.    Patient is accompained by: Family member    Pertinent History Pt. c/o numbness and tingling in LE extremities.  Difficulty sleeping and h/o falls due to foot drop/ weakness/ fatigue. H/o L ankle gout    Patient Stated Goals Improve LE strength/ balance and coordination    Currently in Pain? Yes              [facility] PT Assessment - 02/04/20 0001      Assessment   Medical Diagnosis L foot drop/ Gait difficulty    Referring Provider (PT) Dr. Malvin Johns    Onset Date/Surgical Date 08/21/19      Precautions   Precautions  Fall      Balance Screen   Has the patient fallen in the past 6 months Yes      Home Environment   Living Environment Private residence      Prior Function   Level of Independence Independent            See flowsheet    Objective measurements completed on examination: See above findings.       See HEP     PT Long Term Goals - 02/04/20 1405      PT LONG TERM GOAL #1   Title Pt. will increase FOTO to 50 to improve pain-free functional mobility.    Baseline Initial: 43    Time 4    Period Weeks    Status New    Target Date 02/18/20      PT LONG TERM GOAL #2   Title Pt. able to ambulate community distances with least assistive device and no AFO with more normalized gait pattern to improve independence.    Baseline L antalgic gait pattern with use of walking stick.  Pt. has heel strike on L (  no AFO needed at this time).    Time 4    Period Weeks    Status New    Target Date 02/18/20      PT LONG TERM GOAL #3   Title Pt. independent with HEP to increase L ankle DF/PF to 5/5 MMT to improve standing tolerance/ gait pattern.    Baseline Ankle strength grossly 4+/5 MMT (joint stiffness in L ankle)    Time 4    Period Weeks    Status New    Target Date 02/18/20      PT LONG TERM GOAL #4   Title Pt. will complete Berg balance test and score >46/56 to improve safety/ decrease fall risk with appropriate assistive device.    Baseline TBD    Time 4    Period Weeks    Status New    Target Date 02/18/20               Plan - 02/04/20 1350    Clinical Impression Statement Pt. is a pleasant 69 y/o male referred to PT for evaluation of L foot drop and balance/gait issues.  Pt. has h/o falls due to L foot drop/ LE weakness. Pt. has extensive PMHx with h/o L femur fracture/ L1 fx./ L ankle gout in past.  Pt. presents with limited B ankle joint stiffness (L/R): DF (6 deg./ 8 deg.), PF (45 deg.), IV and EV (moderate stiffness)- poor ankle control/ coordination.  B LE  ankle strengthening grossly 5/5 MMT and L ankle DF/PF 4+/5.  FOTO: initial 43/ goal 50.  Pt. ambulates with L antalgic gait and use of walking stick on level surfaces.  Pt. will benefit from gait training/ ankle stabilization and no AFO at this time to improve safety/ independence.    Stability/Clinical Decision Making Evolving/Moderate complexity    Clinical Decision Making Moderate    Rehab Potential Fair    PT Frequency 2x / week    PT Duration 4 weeks    PT Treatment/Interventions ADLs/Self Care Home Management;Aquatic Therapy;Cryotherapy;Electrical Stimulation;Moist Heat;Gait training;Stair training;Functional mobility training;Therapeutic activities;Therapeutic exercise;Balance training;Neuromuscular re-education;Manual techniques;Patient/family education;Passive range of motion;Dry needling    PT Next Visit Plan Reassess HEP/ balance testing.    PT Home Exercise Plan [facility]           Patient will benefit from skilled therapeutic intervention in order to improve the following deficits and impairments:  Abnormal gait, Decreased coordination, Decreased range of motion, Difficulty walking, Decreased endurance, Decreased activity tolerance, Pain, Improper body mechanics, Impaired flexibility, Hypomobility, Postural dysfunction, Decreased strength  Visit Diagnosis: Foot drop, left  Joint stiffness  Muscle weakness (generalized)  Imbalance  Gait difficulty     Problem List Patient Active Problem List   Diagnosis Date Noted  . Benign prostatic hyperplasia 11/01/2019  . Elevated PSA 11/01/2019  . Gross hematuria 11/01/2019   Pura Spice, PT, DPT # [phone] 02/04/2020, 2:16 PM  [place] [address] [place], [place], [address] Phone: [phone]   Fax:  [phone]  Name: Andres Young MRN: [id number] Date of Birth: 1950-10-11

## 2020-01-23 ENCOUNTER — Ambulatory Visit
Admission: EM | Admit: 2020-01-23 | Discharge: 2020-01-23 | Disposition: A | Discharge status: Home / Self Care | Payer: Medicare Other | Attending: Family Medicine | Admitting: Family Medicine

## 2020-01-23 ENCOUNTER — Encounter: Payer: Self-pay | Admitting: Emergency Medicine

## 2020-01-23 ENCOUNTER — Other Ambulatory Visit: Payer: Self-pay

## 2020-01-23 DIAGNOSIS — L299 Pruritus, unspecified: Secondary | ICD-10-CM

## 2020-01-23 DIAGNOSIS — R21 Rash and other nonspecific skin eruption: Secondary | ICD-10-CM

## 2020-01-23 DIAGNOSIS — L237 Allergic contact dermatitis due to plants, except food: Secondary | ICD-10-CM

## 2020-01-23 MED ORDER — PREDNISONE 10 MG PO TABS: ORAL_TABLET | ORAL | 0 refills | Status: DC

## 2020-01-23 MED ORDER — DOXYCYCLINE HYCLATE 100 MG PO CAPS: 100.0000 mg | ORAL_CAPSULE | Freq: Two times a day (BID) | ORAL | 0 refills | Status: DC

## 2020-01-23 NOTE — ED Triage Notes (Signed)
Patient c/o itchy rash on his face and arms that started yesterday.  Patient states that he has been outside in his yard recently.  Patient also reports swelling under his eyes.

## 2020-01-23 NOTE — ED Provider Notes (Signed)
[facility] ____________________________________________  Time seen: Approximately 12:38 PM  I have reviewed the triage vital signs and the nursing notes.   HISTORY  Chief Complaint Rash and Facial Swelling   HPI Andres Young is a 69 y.o. male presenting for evaluation of rash and pruritus.  Patient reports this past week he had been working out in his yard where he knows he has poison oak and poison ivy.  Reports few days later he began having with diffuse itching on extremities as well as noticing itching to bilateral face.  States this morning he noticed some puffiness on both sides of his face underneath his eyes as well as right earlobe is more swollen.  States this feels consistent with his previous poison oak and poison ivy.  Denies any other changes in contacts.  Denies changes in foods, medicines, lotions.  Denies chest pain, shortness of breath, fevers.  Denies any atypical joint pains, headache, numbness, facial asymmetry, weakness.  Denies any oral swelling or difficulty swallowing.  Denies recent fevers or sickness.  No recent antibiotic use.  Reports otherwise doing well.  Has tried over-the-counter creams without resolution.    Past Medical History:  Diagnosis Date  . Cataract   . CIDP (chronic inflammatory demyelinating polyneuropathy) (HCC)   . Gout   . Guillain Barr syndrome (HCC)   . Heart attack (HCC)   . Heart disease   . Kidney stone   . Prostate enlargement   . Reflux gastritis     Patient Active Problem List   Diagnosis Date Noted  . Benign prostatic hyperplasia 11/01/2019  . Elevated PSA 11/01/2019  . Gross hematuria 11/01/2019    Past Surgical History:  Procedure Laterality Date  . CORONARY ANGIOPLASTY WITH STENT PLACEMENT    . EYE SURGERY       No current facility-administered medications for this encounter.  Current Outpatient Medications:  .  allopurinol (ZYLOPRIM) 100 MG tablet, Take 100 mg by mouth 2 (two) times daily.,  Disp: , Rfl:  .  aspirin 81 MG chewable tablet, Chew by mouth daily., Disp: , Rfl:  .  atorvastatin (LIPITOR) 40 MG tablet, Take 40 mg by mouth daily., Disp: , Rfl:  .  calcium-vitamin D (OSCAL WITH D) 500-200 MG-UNIT tablet, Take 1 tablet by mouth., Disp: , Rfl:  .  clopidogrel (PLAVIX) 75 MG tablet, Take 75 mg by mouth daily., Disp: , Rfl:  .  colchicine 0.6 MG tablet, Take 0.6 mg by mouth daily., Disp: , Rfl:  .  diltiazem (CARDIZEM) 120 MG tablet, Take 120 mg by mouth daily., Disp: , Rfl:  .  DULoxetine (CYMBALTA) 30 MG capsule, Take 30 mg by mouth 3 (three) times daily., Disp: , Rfl:  .  dutasteride (AVODART) 0.5 MG capsule, Take 0.5 mg by mouth daily., Disp: , Rfl:  .  gabapentin (NEURONTIN) 300 MG capsule, Take 300 mg by mouth 3 (three) times daily., Disp: , Rfl:  .  hydrOXYzine (ATARAX/VISTARIL) 25 MG tablet, Take by mouth., Disp: , Rfl:  .  lansoprazole (PREVACID) 30 MG capsule, Take 30 mg by mouth daily at 12 noon., Disp: , Rfl:  .  vitamin B-12 (CYANOCOBALAMIN) 100 MCG tablet, Take 100 mcg by mouth daily., Disp: , Rfl:  .  doxycycline (VIBRAMYCIN) 100 MG capsule, Take 1 capsule (100 mg total) by mouth 2 (two) times daily., Disp: 20 capsule, Rfl: 0 .  predniSONE (DELTASONE) 10 MG tablet, Take 60mg  orally day one, then 55 mg orally day two, then 50 mg  orally day three, then taper by 5 mg daily over 12 days until complete., Disp: 35 tablet, Rfl: 0  Allergies Patient has no known allergies.  Family History  Problem Relation Age of Onset  . Heart disease Mother   . Kidney disease Father     Social History Social History   Tobacco Use  . Smoking status: Former Games developer  . Smokeless tobacco: Never Used  Substance Use Topics  . Alcohol use: Not Currently  . Drug use: Not Currently    Review of Systems Constitutional: No fever/chills Eyes: No visual changes. ENT: No sore throat. Cardiovascular: Denies chest pain. Respiratory: Denies shortness of breath. Gastrointestinal: No  abdominal pain.  No nausea, no vomiting.  No diarrhea.  No constipation. Genitourinary: Negative for dysuria. Musculoskeletal: Negative for back pain. Skin: Positive itching.  ____________________________________________   PHYSICAL EXAM:  VITAL SIGNS: ED Triage Vitals  Enc Vitals Group     BP 01/23/20 1220 133/76     Pulse Rate 01/23/20 1220 76     Resp 01/23/20 1220 16     Temp 01/23/20 1220 97.9 F (36.6 C)     Temp Source 01/23/20 1220 Oral     SpO2 01/23/20 1220 100 %     Weight 01/23/20 1216 228 lb (103.4 kg)     Height 01/23/20 1216 5\' 11"  (1.803 m)     Head Circumference --      Peak Flow --      Pain Score 01/23/20 1216 0     Pain Loc --      Pain Edu? --      Excl. in GC? --     Constitutional: Alert and oriented. Well appearing and in no acute distress. Eyes: Conjunctivae are normal. PERRL. EOMI. ENT      Head: Normocephalic and atraumatic.      Mouth/Throat: Mucous membranes are moist. No edema noted. Cardiovascular: Normal rate, regular rhythm. Grossly normal heart sounds.  Good peripheral circulation. Respiratory: Normal respiratory effort without tachypnea nor retractions. Breath sounds are clear and equal bilaterally. No wheezes, rales, rhonchi. Musculoskeletal: Steady gait. Neurologic:  Normal speech and language.  Skin:  Skin is warm, dry.  Except: Diffuse pruritus, bilateral cheeks mild erythematous blanchable, right lower earlobe erythematous, mild swelling and mild induration, no break in skin, no drainage. Psychiatric: Mood and affect are normal. Speech and behavior are normal. Patient exhibits appropriate insight and judgment   ___________________________________________   LABS (all labs ordered are listed, but only abnormal results are displayed)  Labs Reviewed - No data to display   PROCEDURES Procedures    INITIAL IMPRESSION / ASSESSMENT AND PLAN / ED COURSE  Pertinent labs & imaging results that were available during my care of the  patient were reviewed by me and considered in my medical decision making (see chart for details).  Room patient.  No acute distress.  Suspect contact dermatitis from working in yard has history with similar presentation.  Never secondary infection to right earlobe.  Will treat with oral prednisone and doxycycline.  Discussed keeping clean, topical antibiotic ointment, monitoring and supportive care.Discussed indication, risks and benefits of medications with patient.   Discussed follow up with Primary care physician this week. Discussed follow up and return parameters including no resolution or any worsening concerns. Patient verbalized understanding and agreed to plan.   ____________________________________________   FINAL CLINICAL IMPRESSION(S) / ED DIAGNOSES  Final diagnoses:  Rash  Pruritus  Allergic contact dermatitis due to plants, except food  ED Discharge Orders         Ordered    doxycycline (VIBRAMYCIN) 100 MG capsule  2 times daily     01/23/20 1239    predniSONE (DELTASONE) 10 MG tablet     01/23/20 1239           Note: This dictation was prepared with Dragon dictation along with smaller phrase technology. Any transcriptional errors that result from this process are unintentional.         Marylene Land, NP 01/23/20 1258

## 2020-01-23 NOTE — Discharge Instructions (Signed)
Take medication as prescribed. Rest. Drink plenty of fluids. Monitor.  ° °Follow up with your primary care physician this week as needed. Return to Urgent care for new or worsening concerns.  ° °

## 2020-01-26 ENCOUNTER — Ambulatory Visit: Payer: Medicare Other | Admitting: Physical Therapy

## 2020-01-28 ENCOUNTER — Other Ambulatory Visit: Payer: Self-pay

## 2020-01-28 ENCOUNTER — Ambulatory Visit: Payer: Medicare Other | Admitting: Physical Therapy

## 2020-01-28 DIAGNOSIS — M2560 Stiffness of unspecified joint, not elsewhere classified: Secondary | ICD-10-CM

## 2020-01-28 DIAGNOSIS — R2689 Other abnormalities of gait and mobility: Secondary | ICD-10-CM

## 2020-01-28 DIAGNOSIS — R269 Unspecified abnormalities of gait and mobility: Secondary | ICD-10-CM

## 2020-01-28 DIAGNOSIS — M21372 Foot drop, left foot: Secondary | ICD-10-CM | POA: Diagnosis not present

## 2020-01-28 DIAGNOSIS — M6281 Muscle weakness (generalized): Secondary | ICD-10-CM

## 2020-02-02 ENCOUNTER — Encounter: Payer: Self-pay | Admitting: Physical Therapy

## 2020-02-02 ENCOUNTER — Ambulatory Visit: Payer: Medicare Other | Admitting: Physical Therapy

## 2020-02-02 ENCOUNTER — Encounter: Payer: Medicare Other | Admitting: Physical Therapy

## 2020-02-02 ENCOUNTER — Other Ambulatory Visit: Payer: Self-pay

## 2020-02-02 DIAGNOSIS — M2560 Stiffness of unspecified joint, not elsewhere classified: Secondary | ICD-10-CM

## 2020-02-02 DIAGNOSIS — M6281 Muscle weakness (generalized): Secondary | ICD-10-CM

## 2020-02-02 DIAGNOSIS — R2689 Other abnormalities of gait and mobility: Secondary | ICD-10-CM

## 2020-02-02 DIAGNOSIS — M21372 Foot drop, left foot: Secondary | ICD-10-CM

## 2020-02-02 DIAGNOSIS — R269 Unspecified abnormalities of gait and mobility: Secondary | ICD-10-CM

## 2020-02-02 NOTE — Therapy (Addendum)
[facility] [address] [place], [place], [address] Phone: [phone]   Fax:  [phone]  Physical Therapy Treatment  Patient Details  Name: Andres Young MRN: [id number] Date of Birth: 1950-10-26 Referring Provider (PT): Dr. Melrose Nakayama   Encounter Date: 01/28/2020     PT End of Session - 02/02/20 1411    Visit Number 2    Number of Visits 9    Date for PT Re-Evaluation 02/18/20    Authorization - Visit Number 2    Authorization - Number of Visits 10    PT Start Time 1324    PT Stop Time 1109    PT Time Calculation (min) 46 min    Activity Tolerance Patient tolerated treatment well    Behavior During Therapy [facility] for tasks assessed/performed            Past Medical History:  Diagnosis Date  . Cataract   . CIDP (chronic inflammatory demyelinating polyneuropathy) ([place])   . Gout   . Guillain Barr syndrome ([place])   . Heart attack ([place])   . Heart disease   . Kidney stone   . Prostate enlargement   . Reflux gastritis     Past Surgical History:  Procedure Laterality Date  . CORONARY ANGIOPLASTY WITH STENT PLACEMENT    . EYE SURGERY      There were no vitals filed for this visit.   Subjective Assessment - 02/04/20 1434    Subjective Pts. wife reports pt. is not having a great day.  Pt. states he is tired prior to tx. session.  Pt. reports no falls but had a couple stumbles since last tx. session.  No c/o pain at this time.    Patient is accompained by: Family member    Pertinent History Pt. c/o numbness and tingling in LE extremities.  Difficulty sleeping and h/o falls due to foot drop/ weakness/ fatigue. H/o L ankle gout    Patient Stated Goals Improve LE strength/ balance and coordination    Currently in Pain? No/denies             Manual tx.:   Supine LE/lumbar generalized stretches.  Focus on gastroc/ soleus stretches in supine (as tolerated)- no increase c/o pain.   Decrease sensation in B feet/lower  leg.  Neuro.:  Walking in //-bars (forward/backwards/lateral)- min. To no UE assist Discussed use of rollator for gait safety Berg balance test: 27/56 (high fall risk) Amb. In clinic with turning/ dynamic tasks     PT Long Term Goals - 02/04/20 1405      PT LONG TERM GOAL #1   Title Pt. will increase FOTO to 50 to improve pain-free functional mobility.    Baseline Initial: 43    Time 4    Period Weeks    Status New    Target Date 02/18/20      PT LONG TERM GOAL #2   Title Pt. able to ambulate community distances with least assistive device and no AFO with more normalized gait pattern to improve independence.    Baseline L antalgic gait pattern with use of walking stick.  Pt. has heel strike on L (no AFO needed at this time).    Time 4    Period Weeks    Status New    Target Date 02/18/20      PT LONG TERM GOAL #3   Title Pt. independent with HEP to increase L ankle DF/PF to 5/5 MMT to improve standing tolerance/  gait pattern.    Baseline Ankle strength grossly 4+/5 MMT (joint stiffness in L ankle)    Time 4    Period Weeks    Status New    Target Date 02/18/20      PT LONG TERM GOAL #4   Title Pt. will complete Berg balance test and score >46/56 to improve safety/ decrease fall risk with appropriate assistive device.    Baseline TBD    Time 4    Period Weeks    Status New    Target Date 02/18/20                 Plan - 02/04/20 1435    Clinical Impression Statement Pt. requires CGA/min. A with use of gait belt during all aspects of balance/ Berg testing.  Pt. scored a 27/56 which places pt. at a 100% risk of falling without appropriate assistive device.  Difficulty with eyes closed tasks/ functional reaching/ tandem/ dynamic tasks.  Pt. requires use of B UE will all aspects of standing/ sitting to control descent and promote safety.    Stability/Clinical Decision Making Evolving/Moderate complexity    Clinical Decision Making Moderate    Rehab Potential Fair     PT Frequency 2x / week    PT Duration 4 weeks    PT Treatment/Interventions ADLs/Self Care Home Management;Aquatic Therapy;Cryotherapy;Electrical Stimulation;Moist Heat;Gait training;Stair training;Functional mobility training;Therapeutic activities;Therapeutic exercise;Balance training;Neuromuscular re-education;Manual techniques;Patient/family education;Passive range of motion;Dry needling    PT Next Visit Plan Progress HEP/ balance training.    PT Home Exercise Plan [facility]           Patient will benefit from skilled therapeutic intervention in order to improve the following deficits and impairments:  Abnormal gait, Decreased coordination, Decreased range of motion, Difficulty walking, Decreased endurance, Decreased activity tolerance, Pain, Improper body mechanics, Impaired flexibility, Hypomobility, Postural dysfunction, Decreased strength  Visit Diagnosis: Foot drop, left  Joint stiffness  Muscle weakness (generalized)  Imbalance  Gait difficulty     Problem List Patient Active Problem List   Diagnosis Date Noted  . Benign prostatic hyperplasia 11/01/2019  . Elevated PSA 11/01/2019  . Gross hematuria 11/01/2019   Cammie Mcgee, PT, DPT # [phone] 02/04/2020, 2:49 PM  [facility] [address] [place], [place], [address] Phone: [phone]   Fax:  [phone]  Name: Andres Young MRN: [id number] Date of Birth: [birth year]

## 2020-02-04 ENCOUNTER — Encounter: Payer: Self-pay | Admitting: Physical Therapy

## 2020-02-04 ENCOUNTER — Other Ambulatory Visit: Payer: Self-pay

## 2020-02-04 ENCOUNTER — Ambulatory Visit: Payer: Medicare Other | Admitting: Physical Therapy

## 2020-02-04 DIAGNOSIS — M2560 Stiffness of unspecified joint, not elsewhere classified: Secondary | ICD-10-CM

## 2020-02-04 DIAGNOSIS — M6281 Muscle weakness (generalized): Secondary | ICD-10-CM

## 2020-02-04 DIAGNOSIS — M21372 Foot drop, left foot: Secondary | ICD-10-CM | POA: Diagnosis not present

## 2020-02-04 DIAGNOSIS — R2689 Other abnormalities of gait and mobility: Secondary | ICD-10-CM

## 2020-02-04 DIAGNOSIS — R269 Unspecified abnormalities of gait and mobility: Secondary | ICD-10-CM

## 2020-02-04 NOTE — Addendum Note (Signed)
Addended by: Cammie Mcgee on: 02/04/2020 02:19 PM   Modules accepted: Orders

## 2020-02-04 NOTE — Therapy (Signed)
[facility] [address] [place], [place], [address] Phone: [phone]   Fax:  [phone]  Physical Therapy Treatment  Patient Details  Name: Andres Young MRN: [id number] Date of Birth: 20-May-1951 Referring Provider (PT): Dr. Malvin Johns   Encounter Date: 02/04/2020   PT End of Session - 02/04/20 1724    Visit Number 4    Number of Visits 9    Date for PT Re-Evaluation 02/18/20    Authorization - Visit Number 4    Authorization - Number of Visits 10    PT Start Time 1515    PT Stop Time 1620    PT Time Calculation (min) 65 min    Activity Tolerance Patient tolerated treatment well;Patient limited by fatigue    Behavior During Therapy [facility] for tasks assessed/performed           Past Medical History:  Diagnosis Date  . Cataract   . CIDP (chronic inflammatory demyelinating polyneuropathy) (HCC)   . Gout   . Guillain Barr syndrome (HCC)   . Heart attack (HCC)   . Heart disease   . Kidney stone   . Prostate enlargement   . Reflux gastritis     Past Surgical History:  Procedure Laterality Date  . CORONARY ANGIOPLASTY WITH STENT PLACEMENT    . EYE SURGERY      There were no vitals filed for this visit.   Subjective Assessment - 02/04/20 1723    Subjective Pt reports fall yesterday ambulating up stairs. Pt states he believes his left foot caught the stair causing his fall. No injury reported from fall. Pt states he has chronic headaches and he is experiencing a bad head ache.    Patient is accompained by: Family member    Pertinent History Pt. c/o numbness and tingling in LE extremities.  Difficulty sleeping and h/o falls due to foot drop/ weakness/ fatigue. H/o L ankle gout    Patient Stated Goals Improve LE strength/ balance and coordination    Currently in Pain? Yes    Pain Score 6     Pain Location Head    Pain Descriptors / Indicators Aching          There.ex:  Nu-Step L5 for 10 min use of UE/LE's  // bar  resisted walking with 1 black band forward/backwards/side-to-side: x3 each direction  Neuro Re-Ed:   // bars: Mirror used as visual cue and proprioceptive feedback  6" stair taps: 1x5 each. Pt demonstrated difficulty with task with no UE support and frustration.  Walking over hurdles (3): x6 with focus on foot clearance  Stair climbing (4 steps): x3 asc/desc progression from B UE on railings to single UE use on rail to no hands on railing. Pt required CGA during stair training with noted increased lateral sway with no use of UE's on railing.  Manual:   B supine calf stretches: 3x30 sec holds B piriformis stretchL 1x30 sec holds  In supine, Hypervolt to plantar surface of B feet to improve sensation (2 min/leg) In hooklying 2 min Hypervolt to B gastroc    PT Long Term Goals - 02/04/20 1405      PT LONG TERM GOAL #1   Title Pt. will increase FOTO to 50 to improve pain-free functional mobility.    Baseline Initial: 43    Time 4    Period Weeks    Status New    Target Date 02/18/20      PT LONG TERM GOAL #2  Title Pt. able to ambulate community distances with least assistive device and no AFO with more normalized gait pattern to improve independence.    Baseline L antalgic gait pattern with use of walking stick.  Pt. has heel strike on L (no AFO needed at this time).    Time 4    Period Weeks    Status New    Target Date 02/18/20      PT LONG TERM GOAL #3   Title Pt. independent with HEP to increase L ankle DF/PF to 5/5 MMT to improve standing tolerance/ gait pattern.    Baseline Ankle strength grossly 4+/5 MMT (joint stiffness in L ankle)    Time 4    Period Weeks    Status New    Target Date 02/18/20      PT LONG TERM GOAL #4   Title Pt. will complete Berg balance test and score >46/56 to improve safety/ decrease fall risk with appropriate assistive device.    Baseline TBD    Time 4    Period Weeks    Status New    Target Date 02/18/20                 Plan -  02/04/20 1725    Clinical Impression Statement Today's session with focus on using mirror feedback to improve pt's ability to clear foot over obstacles. Mirror used as Designer, jewellery. Pt required CGA in // bars for 6" step-ups and forward stepping over hurdles requiring verbal cues for widening BOS and for maintaining constant dorsiflexion of BLE's to clear step/ hurdles. Pt displayed ability to ascend/descend stairs with 2 hand support on B railings to single hand support to no hand support. With no hand support pt displayed lateral sway and need for CGA from therapist and occasional balance correction. Pt displays improved ability to actively dorsiflex and clear objects with visual feedback from mirror to improve proprioceptive feedback.  PT discussed proper wrist cock up splint for carpal tunnel to wear at night.    Stability/Clinical Decision Making Evolving/Moderate complexity    Clinical Decision Making Moderate    Rehab Potential Fair    PT Frequency 2x / week    PT Duration 4 weeks    PT Treatment/Interventions ADLs/Self Care Home Management;Aquatic Therapy;Cryotherapy;Electrical Stimulation;Moist Heat;Gait training;Stair training;Functional mobility training;Therapeutic activities;Therapeutic exercise;Balance training;Neuromuscular re-education;Manual techniques;Patient/family education;Passive range of motion;Dry needling    PT Next Visit Plan Progress HEP/ balance training.    PT Home Exercise Plan [facility]           Patient will benefit from skilled therapeutic intervention in order to improve the following deficits and impairments:  Abnormal gait, Decreased coordination, Decreased range of motion, Difficulty walking, Decreased endurance, Decreased activity tolerance, Pain, Improper body mechanics, Impaired flexibility, Hypomobility, Postural dysfunction, Decreased strength  Visit Diagnosis: Foot drop, left  Joint stiffness  Muscle weakness  (generalized)  Imbalance  Gait difficulty     Problem List Patient Active Problem List   Diagnosis Date Noted  . Benign prostatic hyperplasia 11/01/2019  . Elevated PSA 11/01/2019  . Gross hematuria 11/01/2019   Pura Spice, PT, DPT # [id number] SPT 02/04/2020, 6:09 PM  [facility] [address] [place], [place], [address] Phone: [phone]   Fax:  [phone]  Name: Thaniel Coluccio MRN: [id number] Date of Birth: 21-Feb-1951

## 2020-02-04 NOTE — Therapy (Signed)
[facility] [address] [place], [place], [address] Phone: [phone]   Fax:  [phone]  Physical Therapy Treatment  Patient Details  Name: Andres Young MRN: [id number] Date of Birth: [birth year] Referring Provider (PT): Dr. Melrose Nakayama   Encounter Date: 02/02/2020   PT End of Session - 02/04/20 1457    Visit Number 3    Number of Visits 9    Date for PT Re-Evaluation 02/18/20    Authorization - Visit Number 3    Authorization - Number of Visits 10    PT Start Time 6962    PT Stop Time 1537    PT Time Calculation (min) 50 min    Activity Tolerance Patient tolerated treatment well;Patient limited by fatigue    Behavior During Therapy [facility] for tasks assessed/performed           Past Medical History:  Diagnosis Date  . Cataract   . CIDP (chronic inflammatory demyelinating polyneuropathy) ([place])   . Gout   . Guillain Barr syndrome ([place])   . Heart attack ([place])   . Heart disease   . Kidney stone   . Prostate enlargement   . Reflux gastritis     Past Surgical History:  Procedure Laterality Date  . CORONARY ANGIOPLASTY WITH STENT PLACEMENT    . EYE SURGERY      There were no vitals filed for this visit.   Subjective Assessment - 02/04/20 1456    Subjective Pt. states he had 2 stumbles over weekend but no falls.  Pt. states he has a headache today and noticeable fatigue prior to tx. session.    Patient is accompained by: Family member    Pertinent History Pt. c/o numbness and tingling in LE extremities.  Difficulty sleeping and h/o falls due to foot drop/ weakness/ fatigue. H/o L ankle gout    Patient Stated Goals Improve LE strength/ balance and coordination    Currently in Pain? Yes           There.ex.:  Nustep L3 10 min. B UE/LE.   Manual tx.:  B gastroc/hamstring/ piriformis 3x30 sec. Hypervolt to plantar surface of foot and B gastroc (slight improvement in sensation)  Neuro:  In hallway: 48m walking  speed: SPC (33.66 sec.)  53m with rollator: 31.345 sec. Walking head turns in hallway (dual tasking with letters) Head turns up/down Marching with verbal cuing to increase toe up/ positioning (min./mod. A to correct)  In //-bars: forward/ lateral walking with mirror feedback (light UE assist) Toe taps on cones (2x12)- CGA.   Periodic LOB with verbal cuing for posture/ cone tapping cones (light touch).        PT Long Term Goals - 02/04/20 1405      PT LONG TERM GOAL #1   Title Pt. will increase FOTO to 50 to improve pain-free functional mobility.    Baseline Initial: 43    Time 4    Period Weeks    Status New    Target Date 02/18/20      PT LONG TERM GOAL #2   Title Pt. able to ambulate community distances with least assistive device and no AFO with more normalized gait pattern to improve independence.    Baseline L antalgic gait pattern with use of walking stick.  Pt. has heel strike on L (no AFO needed at this time).    Time 4    Period Weeks    Status New    Target Date 02/18/20  PT LONG TERM GOAL #3   Title Pt. independent with HEP to increase L ankle DF/PF to 5/5 MMT to improve standing tolerance/ gait pattern.    Baseline Ankle strength grossly 4+/5 MMT (joint stiffness in L ankle)    Time 4    Period Weeks    Status New    Target Date 02/18/20      PT LONG TERM GOAL #4   Title Pt. will complete Berg balance test and score >46/56 to improve safety/ decrease fall risk with appropriate assistive device.    Baseline TBD    Time 4    Period Weeks    Status New    Target Date 02/18/20               Plan - 02/04/20 1502    Clinical Impression Statement Pt. has an improved gait speed while ambulating with use of rollator as compared to [facility].  Pt. has a noticeable decrease in gait speed with dual tasking/ head turns/ consistent gait pattern.  Pt. benefit from use of CGA/ UE assist to decrease fall risk/ promote safety.  Pt. has improved sensation/ decrease  gastroc muscle tension after manual tx./ Hypervolt.    Stability/Clinical Decision Making Evolving/Moderate complexity    Clinical Decision Making Moderate    Rehab Potential Fair    PT Frequency 2x / week    PT Duration 4 weeks    PT Treatment/Interventions ADLs/Self Care Home Management;Aquatic Therapy;Cryotherapy;Electrical Stimulation;Moist Heat;Gait training;Stair training;Functional mobility training;Therapeutic activities;Therapeutic exercise;Balance training;Neuromuscular re-education;Manual techniques;Patient/family education;Passive range of motion;Dry needling    PT Next Visit Plan Progress HEP/ balance training.    PT Home Exercise Plan [facility]           Patient will benefit from skilled therapeutic intervention in order to improve the following deficits and impairments:  Abnormal gait, Decreased coordination, Decreased range of motion, Difficulty walking, Decreased endurance, Decreased activity tolerance, Pain, Improper body mechanics, Impaired flexibility, Hypomobility, Postural dysfunction, Decreased strength  Visit Diagnosis: Foot drop, left  Joint stiffness  Muscle weakness (generalized)  Imbalance  Gait difficulty     Problem List Patient Active Problem List   Diagnosis Date Noted  . Benign prostatic hyperplasia 11/01/2019  . Elevated PSA 11/01/2019  . Gross hematuria 11/01/2019   Andres Young, PT, DPT # [id number] 02/04/2020, 3:06 PM  [place] [address] [place], [place], [address] Phone: [phone]   Fax:  [phone]  Name: Andres Young MRN: [id number] Date of Birth: 1951-05-29

## 2020-02-09 ENCOUNTER — Ambulatory Visit: Payer: Medicare Other | Admitting: Physical Therapy

## 2020-02-09 ENCOUNTER — Encounter: Payer: Self-pay | Admitting: Physical Therapy

## 2020-02-09 ENCOUNTER — Other Ambulatory Visit: Payer: Self-pay

## 2020-02-09 DIAGNOSIS — M2560 Stiffness of unspecified joint, not elsewhere classified: Secondary | ICD-10-CM

## 2020-02-09 DIAGNOSIS — R2689 Other abnormalities of gait and mobility: Secondary | ICD-10-CM

## 2020-02-09 DIAGNOSIS — R269 Unspecified abnormalities of gait and mobility: Secondary | ICD-10-CM

## 2020-02-09 DIAGNOSIS — M6281 Muscle weakness (generalized): Secondary | ICD-10-CM

## 2020-02-09 DIAGNOSIS — M21372 Foot drop, left foot: Secondary | ICD-10-CM

## 2020-02-09 NOTE — Therapy (Signed)
[facility] [address] [place], [place], [address] Phone: [phone]   Fax:  [phone]  Physical Therapy Treatment  Patient Details  Name: Andres Young MRN: [id number] Date of Birth: 1950-12-11 Referring Provider (PT): Dr. Melrose Nakayama   Encounter Date: 02/09/2020   PT End of Session - 02/09/20 1032    Visit Number 5    Number of Visits 9    Date for PT Re-Evaluation 02/18/20    Authorization - Visit Number 5    Authorization - Number of Visits 10    PT Start Time 1028    PT Stop Time 1110    PT Time Calculation (min) 42 min    Activity Tolerance Patient tolerated treatment well;Patient limited by fatigue    Behavior During Therapy [facility] for tasks assessed/performed           Past Medical History:  Diagnosis Date  . Cataract   . CIDP (chronic inflammatory demyelinating polyneuropathy) ([place])   . Gout   . Guillain Barr syndrome ([place])   . Heart attack ([place])   . Heart disease   . Kidney stone   . Prostate enlargement   . Reflux gastritis     Past Surgical History:  Procedure Laterality Date  . CORONARY ANGIOPLASTY WITH STENT PLACEMENT    . EYE SURGERY      There were no vitals filed for this visit.   Subjective Assessment - 02/09/20 1030    Subjective Pt reports a few stumbles since previous PT session. No falls. Pt states he continues to catch his toes.    Patient is accompained by: Family member    Pertinent History Pt. c/o numbness and tingling in LE extremities.  Difficulty sleeping and h/o falls due to foot drop/ weakness/ fatigue. H/o L ankle gout    Patient Stated Goals Improve LE strength/ balance and coordination    Currently in Pain? No/denies    Pain Score 0-No pain          There.ex:   Nu-Step: L5 for 6 min use of UE's and LE's, 4 min LE's Trampoline ball toss with small blue medicine ball with feet together: 1x15, CGA, no LOB Trampoline ball toss with small blue medicine ball on airex pad:  1x20 practicing ankle and hip strategy, requiring CGA and correction for LOB by therapist  Trampoline ball toss with small blue medicine ball tandem stance: 1x20 requiring CGA 6" step toe taps no UE use: 1x15  Neuro Re-Ed:   Weight shifts on star balance lines: Forward and diaganol L/R w/ verbal cues on heel strike, 1x12 each direction Gait: use of blue ladder on floor to improve B stride length. x4 down and backwards    PT Long Term Goals - 02/04/20 1405      PT LONG TERM GOAL #1   Title Pt. will increase FOTO to 50 to improve pain-free functional mobility.    Baseline Initial: 43    Time 4    Period Weeks    Status New    Target Date 02/18/20      PT LONG TERM GOAL #2   Title Pt. able to ambulate community distances with least assistive device and no AFO with more normalized gait pattern to improve independence.    Baseline L antalgic gait pattern with use of walking stick.  Pt. has heel strike on L (no AFO needed at this time).    Time 4    Period Weeks    Status  New    Target Date 02/18/20      PT LONG TERM GOAL #3   Title Pt. independent with HEP to increase L ankle DF/PF to 5/5 MMT to improve standing tolerance/ gait pattern.    Baseline Ankle strength grossly 4+/5 MMT (joint stiffness in L ankle)    Time 4    Period Weeks    Status New    Target Date 02/18/20      PT LONG TERM GOAL #4   Title Pt. will complete Berg balance test and score >46/56 to improve safety/ decrease fall risk with appropriate assistive device.    Baseline TBD    Time 4    Period Weeks    Status New    Target Date 02/18/20                 Plan - 02/09/20 1203    Clinical Impression Statement Today's exercises focused on heel strike during gait and reinforcing toe raise and proprioceptive exercises to reduce pt's risk of falls by scuffing toes. Pt required CGA for balance exercises and mod verbal and tactile cues to reinforce ankle and hip strategies when balance on foam and for  performing weight shifts. Pt continues to display lateral sway when asc/desc stairs with recirpricol gait pattern. Pt is beginning to be able to correct himself when noticing LOB.  Pt can continue to benefit from skilled treatment to reduce risk of falls.    Stability/Clinical Decision Making Evolving/Moderate complexity    Clinical Decision Making Moderate    Rehab Potential Fair    PT Frequency 2x / week    PT Duration 4 weeks    PT Treatment/Interventions ADLs/Self Care Home Management;Aquatic Therapy;Cryotherapy;Electrical Stimulation;Moist Heat;Gait training;Stair training;Functional mobility training;Therapeutic activities;Therapeutic exercise;Balance training;Neuromuscular re-education;Manual techniques;Patient/family education;Passive range of motion;Dry needling    PT Next Visit Plan Progress HEP/ balance training.    PT Home Exercise Plan [facility]           Patient will benefit from skilled therapeutic intervention in order to improve the following deficits and impairments:  Abnormal gait, Decreased coordination, Decreased range of motion, Difficulty walking, Decreased endurance, Decreased activity tolerance, Pain, Improper body mechanics, Impaired flexibility, Hypomobility, Postural dysfunction, Decreased strength  Visit Diagnosis: Foot drop, left  Joint stiffness  Muscle weakness (generalized)  Imbalance  Gait difficulty     Problem List Patient Active Problem List   Diagnosis Date Noted  . Benign prostatic hyperplasia 11/01/2019  . Elevated PSA 11/01/2019  . Gross hematuria 11/01/2019   Cammie Mcgee, PT, DPT # [address] SPT 02/10/2020, 12:57 PM  [facility] [address] [place], [place], [address] Phone: [phone]   Fax:  [phone]  Name: Andres Young MRN: [id number] Date of Birth: [birth year]

## 2020-02-11 ENCOUNTER — Ambulatory Visit: Payer: Medicare Other | Admitting: Physical Therapy

## 2020-02-16 ENCOUNTER — Other Ambulatory Visit: Payer: Self-pay

## 2020-02-16 ENCOUNTER — Encounter: Payer: Self-pay | Admitting: Physical Therapy

## 2020-02-16 ENCOUNTER — Ambulatory Visit: Payer: Medicare Other | Admitting: Physical Therapy

## 2020-02-16 DIAGNOSIS — R2689 Other abnormalities of gait and mobility: Secondary | ICD-10-CM

## 2020-02-16 DIAGNOSIS — M6281 Muscle weakness (generalized): Secondary | ICD-10-CM

## 2020-02-16 DIAGNOSIS — M2560 Stiffness of unspecified joint, not elsewhere classified: Secondary | ICD-10-CM

## 2020-02-16 DIAGNOSIS — M21372 Foot drop, left foot: Secondary | ICD-10-CM

## 2020-02-16 DIAGNOSIS — R269 Unspecified abnormalities of gait and mobility: Secondary | ICD-10-CM

## 2020-02-16 NOTE — Therapy (Signed)
[facility] [address] [place], [place], [address] Phone: [phone]   Fax:  [phone]  Physical Therapy Treatment  Patient Details  Name: Andres Young MRN: [id number] Date of Birth: 1951/03/28 Referring Provider (PT): Dr. Malvin Johns   Encounter Date: 02/16/2020   PT End of Session - 02/16/20 1150    Visit Number 6    Number of Visits 9    Date for PT Re-Evaluation 02/18/20    Authorization - Visit Number 6    Authorization - Number of Visits 10    PT Start Time 1029    PT Stop Time 1115    PT Time Calculation (min) 46 min    Activity Tolerance Patient tolerated treatment well;Patient limited by fatigue    Behavior During Therapy [facility] for tasks assessed/performed           Past Medical History:  Diagnosis Date  . Cataract   . CIDP (chronic inflammatory demyelinating polyneuropathy) (HCC)   . Gout   . Guillain Barr syndrome (HCC)   . Heart attack (HCC)   . Heart disease   . Kidney stone   . Prostate enlargement   . Reflux gastritis     Past Surgical History:  Procedure Laterality Date  . CORONARY ANGIOPLASTY WITH STENT PLACEMENT    . EYE SURGERY      There were no vitals filed for this visit.   Subjective Assessment - 02/16/20 1144    Subjective Pt reports being stung by yellow jackets over weekend (~35-40 times) where pt had to report to ER and receive Lidocaine injections. Pt also had two falls: one when descending small ramp and the other inside the house in both stances the L foot dragged surface causing the two falls. No serious injuries reported. Bruising on R thorax.    Patient is accompained by: Family member    Pertinent History Pt. c/o numbness and tingling in LE extremities.  Difficulty sleeping and h/o falls due to foot drop/ weakness/ fatigue. H/o L ankle gout    Patient Stated Goals Improve LE strength/ balance and coordination    Currently in Pain? No/denies    Pain Score 0-No pain    Multiple  Pain Sites No            There.ex:   Nu-Step L5 for 10 min with use of UE's/LE's  Neuro Re-Ed:   Assessed pt's ability to perform tasks with and without an AFO on LLE:  Gait, asc/desc stairs, stepping over hurdles, ambulating outside over grass and asc/desc curbs  No significant Improvements noted with AFO donned. Pt demonstrated ability to maintain clearing LLE with all tasks without AFO donned on LLE and demonstrated no LOB throughout all tasks. Pt also able to maintain conversation while ambulating with minor decrease in gait speed. From reports of pt, his LLE will randomly catch surface and pt does not have the stepping strategy to recover at this time. LLE foot catching is not a habitual issue. Discussed with pt and spouse that AFO is still not recommended at this time and that future treatment will focus on stepping strategy to reduce risk of falls.   Pt educated on energy conservation techniques. Over exaggerating L hip flexion and DF when swing through during gait phase, taking frequent breaks when busy day and is feeling fatigued.     PT Education - 02/16/20 1149    Education Details Pt educated on pros and cons of AFO. Pt educated no difference in  gait, stairs, ambulating outside over uneven surfaces and asc/desc stairs with AFO versus without. Pt educated further treatment should focus on recovery of balance to reduce falls. Pt educated on energy conservation techniques.    Person(s) Educated Patient;Spouse    Methods Explanation;Demonstration    Comprehension Verbalized understanding               PT Long Term Goals - 02/04/20 1405      PT LONG TERM GOAL #1   Title Pt. will increase FOTO to 50 to improve pain-free functional mobility.    Baseline Initial: 43    Time 4    Period Weeks    Status New    Target Date 02/18/20      PT LONG TERM GOAL #2   Title Pt. able to ambulate community distances with least assistive device and no AFO with more normalized gait  pattern to improve independence.    Baseline L antalgic gait pattern with use of walking stick.  Pt. has heel strike on L (no AFO needed at this time).    Time 4    Period Weeks    Status New    Target Date 02/18/20      PT LONG TERM GOAL #3   Title Pt. independent with HEP to increase L ankle DF/PF to 5/5 MMT to improve standing tolerance/ gait pattern.    Baseline Ankle strength grossly 4+/5 MMT (joint stiffness in L ankle)    Time 4    Period Weeks    Status New    Target Date 02/18/20      PT LONG TERM GOAL #4   Title Pt. will complete Berg balance test and score >46/56 to improve safety/ decrease fall risk with appropriate assistive device.    Baseline TBD    Time 4    Period Weeks    Status New    Target Date 02/18/20                 Plan - 02/16/20 1151    Clinical Impression Statement Due to two falls over the weekend, pt assessed with AFO on LLE versus without AFO in gait, stairs, ambulating over hurdles, and ambulating outside over grass, asc/desc curbs. No significant difference in gait, stairs, outside walking, ambualting over cursb with AFO donned. Pt has made improvements in noticing when off balance and correcting statically where before pt could not feel when off balance prior. Pt and spouse educated on future treatment with focus on stepping strategy to prevent future falls when L foot scuffs surface. Pt educated on anterior tib strength is 5/5 MMT and that wearing an AFO will cause weakness of that muscle long term leading to more foot dragging. Pt and spouse in agreement on currently not recommedning AFO at this time and focus of treatment on improving stepping strategy.    Stability/Clinical Decision Making Evolving/Moderate complexity    Rehab Potential Fair    PT Frequency 2x / week    PT Duration 4 weeks    PT Treatment/Interventions ADLs/Self Care Home Management;Aquatic Therapy;Cryotherapy;Electrical Stimulation;Moist Heat;Gait training;Stair  training;Functional mobility training;Therapeutic activities;Therapeutic exercise;Balance training;Neuromuscular re-education;Manual techniques;Patient/family education;Passive range of motion;Dry needling    PT Next Visit Plan Stepping strategy    PT Home Exercise Plan [facility]           Patient will benefit from skilled therapeutic intervention in order to improve the following deficits and impairments:  Abnormal gait, Decreased coordination, Decreased range of motion, Difficulty walking, Decreased endurance,  Decreased activity tolerance, Pain, Improper body mechanics, Impaired flexibility, Hypomobility, Postural dysfunction, Decreased strength  Visit Diagnosis: Foot drop, left  Joint stiffness  Muscle weakness (generalized)  Imbalance  Gait difficulty     Problem List Patient Active Problem List   Diagnosis Date Noted  . Benign prostatic hyperplasia 11/01/2019  . Elevated PSA 11/01/2019  . Gross hematuria 11/01/2019   Cammie Mcgee, PT, DPT # [id number], SPT 02/17/2020, 9:41 AM  [facility] [address] [place], [place], [address] Phone: [phone]   Fax:  [phone]  Name: Andres Young MRN: [id number] Date of Birth: 05-04-1951

## 2020-02-18 ENCOUNTER — Other Ambulatory Visit: Payer: Self-pay

## 2020-02-18 ENCOUNTER — Encounter: Payer: Self-pay | Admitting: Physical Therapy

## 2020-02-18 ENCOUNTER — Ambulatory Visit: Payer: Medicare Other | Attending: Neurology | Admitting: Physical Therapy

## 2020-02-18 DIAGNOSIS — M2560 Stiffness of unspecified joint, not elsewhere classified: Secondary | ICD-10-CM | POA: Insufficient documentation

## 2020-02-18 DIAGNOSIS — R2689 Other abnormalities of gait and mobility: Secondary | ICD-10-CM | POA: Diagnosis present

## 2020-02-18 DIAGNOSIS — R269 Unspecified abnormalities of gait and mobility: Secondary | ICD-10-CM | POA: Insufficient documentation

## 2020-02-18 DIAGNOSIS — M6281 Muscle weakness (generalized): Secondary | ICD-10-CM | POA: Insufficient documentation

## 2020-02-18 DIAGNOSIS — M21372 Foot drop, left foot: Secondary | ICD-10-CM | POA: Diagnosis present

## 2020-02-18 NOTE — Therapy (Signed)
[facility] [address] [place], [place], [address] Phone: [phone]   Fax:  [phone]  Physical Therapy Treatment  Patient Details  Name: Andres Young MRN: [id number] Date of Birth: [birth year] Referring Provider (PT): Dr. Malvin Johns   Encounter Date: 02/18/2020   PT End of Session - 02/18/20 1220    Visit Number 7    Number of Visits 9    Date for PT Re-Evaluation 02/18/20    Authorization - Visit Number 7    Authorization - Number of Visits 10    PT Start Time 1030    PT Stop Time 1117    PT Time Calculation (min) 47 min    Activity Tolerance Patient tolerated treatment well;Patient limited by fatigue    Behavior During Therapy [facility] for tasks assessed/performed           Past Medical History:  Diagnosis Date  . Cataract   . CIDP (chronic inflammatory demyelinating polyneuropathy) (HCC)   . Gout   . Guillain Barr syndrome (HCC)   . Heart attack (HCC)   . Heart disease   . Kidney stone   . Prostate enlargement   . Reflux gastritis     Past Surgical History:  Procedure Laterality Date  . CORONARY ANGIOPLASTY WITH STENT PLACEMENT    . EYE SURGERY      There were no vitals filed for this visit.   Subjective Assessment - 02/18/20 1219    Subjective Pt reports multiple stumbles between sessions wih asc stairs in garage. No falls reported.    Patient is accompained by: Family member    Pertinent History Pt. c/o numbness and tingling in LE extremities.  Difficulty sleeping and h/o falls due to foot drop/ weakness/ fatigue. H/o L ankle gout    Patient Stated Goals Improve LE strength/ balance and coordination    Currently in Pain? No/denies    Pain Score 0-No pain          There.ex:   Nu-Step L5 for 10 min with UE/LE use  Neuro Re-Ed:   Forward and diagonal weight shifts L/R: 2x8/direction Resisted walking in // bars with 2 black tubings: Use of perturbation requiring pt to step to improve stepping  strategy to reduce future risk of falls. x8 forward/backward. Poor proprioception led to pt coming up on toes when resistance pulled pt forward. Use of mirror as visual feedback for proprioceptive deficits.  Medium medball trampoline toss on airex to improve hip strategy: x15 Medium medball toss in B tandem stance to improve hip strategy/stepping strategy: x15   Manual Therapy: Pt in supine   Hypervolt to B plantar surface of pt's feet for improved sensation 2/2 polyneuropathy (11 min.)    PT Long Term Goals - 02/04/20 1405      PT LONG TERM GOAL #1   Title Pt. will increase FOTO to 50 to improve pain-free functional mobility.    Baseline Initial: 43    Time 4    Period Weeks    Status New    Target Date 02/18/20      PT LONG TERM GOAL #2   Title Pt. able to ambulate community distances with least assistive device and no AFO with more normalized gait pattern to improve independence.    Baseline L antalgic gait pattern with use of walking stick.  Pt. has heel strike on L (no AFO needed at this time).    Time 4    Period Weeks    Status  New    Target Date 02/18/20      PT LONG TERM GOAL #3   Title Pt. independent with HEP to increase L ankle DF/PF to 5/5 MMT to improve standing tolerance/ gait pattern.    Baseline Ankle strength grossly 4+/5 MMT (joint stiffness in L ankle)    Time 4    Period Weeks    Status New    Target Date 02/18/20      PT LONG TERM GOAL #4   Title Pt. will complete Berg balance test and score >46/56 to improve safety/ decrease fall risk with appropriate assistive device.    Baseline TBD    Time 4    Period Weeks    Status New    Target Date 02/18/20              Plan - 02/18/20 1221    Clinical Impression Statement Focus on stepping strategy today secondary due pt reports of not being able to catch himself after L foot scuffs ground. Pt performed bouts of stepping weight shifts forwards and diagonally with multiple LOB's requiring therapist  correction to regain balance. Pt performed resisted walking in parallel bars forwards and backwards requiring mirror as visual cue for proper heel strike and use of perturbations from resistance requiring pt. to lift feet to replicate a stepping strategy. Pt required multiple hand holds to correct balance.    Stability/Clinical Decision Making Evolving/Moderate complexity    Clinical Decision Making Moderate    Rehab Potential Fair    PT Frequency 2x / week    PT Duration 4 weeks    PT Treatment/Interventions ADLs/Self Care Home Management;Aquatic Therapy;Cryotherapy;Electrical Stimulation;Moist Heat;Gait training;Stair training;Functional mobility training;Therapeutic activities;Therapeutic exercise;Balance training;Neuromuscular re-education;Manual techniques;Patient/family education;Passive range of motion;Dry needling    PT Next Visit Plan Stepping strategy.  RECERT next tx.    PT Home Exercise Plan [facility]           Patient will benefit from skilled therapeutic intervention in order to improve the following deficits and impairments:  Abnormal gait, Decreased coordination, Decreased range of motion, Difficulty walking, Decreased endurance, Decreased activity tolerance, Pain, Improper body mechanics, Impaired flexibility, Hypomobility, Postural dysfunction, Decreased strength  Visit Diagnosis: Foot drop, left  Joint stiffness  Muscle weakness (generalized)  Imbalance  Gait difficulty     Problem List Patient Active Problem List   Diagnosis Date Noted  . Benign prostatic hyperplasia 11/01/2019  . Elevated PSA 11/01/2019  . Gross hematuria 11/01/2019   Cammie Mcgee, PT, DPT # [id number], SPT 02/18/2020, 3:46 PM  [facility] [address] [place] [address] [place], [place], [address] Phone: [phone]   Fax:  [phone]  Name: Andres Young MRN: [id number] Date of Birth: June 10, 1951

## 2020-02-23 ENCOUNTER — Other Ambulatory Visit: Payer: Self-pay

## 2020-02-23 ENCOUNTER — Encounter: Payer: Self-pay | Admitting: Physical Therapy

## 2020-02-23 ENCOUNTER — Ambulatory Visit: Payer: Medicare Other | Admitting: Physical Therapy

## 2020-02-23 DIAGNOSIS — R269 Unspecified abnormalities of gait and mobility: Secondary | ICD-10-CM

## 2020-02-23 DIAGNOSIS — M2560 Stiffness of unspecified joint, not elsewhere classified: Secondary | ICD-10-CM

## 2020-02-23 DIAGNOSIS — M21372 Foot drop, left foot: Secondary | ICD-10-CM | POA: Diagnosis not present

## 2020-02-23 DIAGNOSIS — M6281 Muscle weakness (generalized): Secondary | ICD-10-CM

## 2020-02-23 DIAGNOSIS — R2689 Other abnormalities of gait and mobility: Secondary | ICD-10-CM

## 2020-02-23 NOTE — Therapy (Signed)
[facility] [address] [place], [place], [address] Phone: [phone]   Fax:  [phone]  Physical Therapy Treatment  Patient Details  Name: Andres Young MRN: [id number] Date of Birth: September 16, 1950 Referring Provider (PT): Dr. Melrose Nakayama   Encounter Date: 02/23/2020   PT End of Session - 02/23/20 1104    Visit Number 8    Number of Visits 16    Date for PT Re-Evaluation 03/17/20    Authorization - Visit Number 1    Authorization - Number of Visits 10    PT Start Time 0910    PT Stop Time 1015    PT Time Calculation (min) 65 min    Activity Tolerance Patient tolerated treatment well;Patient limited by fatigue    Behavior During Therapy [facility] for tasks assessed/performed           Past Medical History:  Diagnosis Date  . Cataract   . CIDP (chronic inflammatory demyelinating polyneuropathy) ([place])   . Gout   . Guillain Barr syndrome ([place])   . Heart attack ([place])   . Heart disease   . Kidney stone   . Prostate enlargement   . Reflux gastritis     Past Surgical History:  Procedure Laterality Date  . CORONARY ANGIOPLASTY WITH STENT PLACEMENT    . EYE SURGERY      There were no vitals filed for this visit.   Subjective Assessment - 02/23/20 1104    Subjective Pt reports he installed railing for 3-4 stairs he uses from garage into home. No stumbles or LOB over the weekend.    Patient is accompained by: Family member    Pertinent History Pt. c/o numbness and tingling in LE extremities.  Difficulty sleeping and h/o falls due to foot drop/ weakness/ fatigue. H/o L ankle gout    Patient Stated Goals Improve LE strength/ balance and coordination    Currently in Pain? No/denies    Pain Score 0-No pain          There.ex:  No charge  Nu-step L5 for 10 min with UE/LE use Reviewed HEP  Neuro Re-Ed:   Ambulating in hallway 60' with no AD. Pt displayed equal step length and consistent B heel strike staying in midline with  no LOB or lateral sway. 68'x2 no AD. Horizontal head turns, dual task of reading letters on wall. Decreased gait speed and reports of minor dizziness. Pt required verbal cues to increase gait speed to increase difficulty.  34'x2 no AD. Reciprocal marching with opposite hand to knee to improve coordination and improved foot clearance. Increased difficulty of task on second bout when pt asked to leave hands at his side then bring them up to knee reciprocally.  34'x1 with no AD side stepping focus on hip clearance and posterolateral hip strength.  Stepping over 3 hurdles and alternating cone taps (x4): 1x6  Manual Therapy:   B hamstring/gastroc stretch in supine. 2x30 sec.  Hypervolt to B calves and plantar surfaces of feet to improve sensation in feet and reduce muscle tightness 5 min/LE.      PT Long Term Goals - 02/23/20 1520      PT LONG TERM GOAL #1   Title Pt. will increase FOTO to 50 to improve pain-free functional mobility.    Baseline Initial: 43    Time 4    Period Weeks    Status On-going    Target Date 03/17/20      PT LONG TERM GOAL #2  Title Pt. able to ambulate community distances with least assistive device and no AFO with more normalized gait pattern to improve independence.    Baseline L antalgic gait pattern with use of walking stick.  Pt. has heel strike on L (no AFO needed at this time).  7/6:  pt. limited by decrease endurance.  Pt. requires use of walking stick and extra time to safely ambulate.  Pt. showing progress towards goal.    Time 4    Period Weeks    Status Partially Met    Target Date 03/17/20      PT LONG TERM GOAL #3   Title Pt. independent with HEP to increase L ankle DF/PF to 5/5 MMT to improve standing tolerance/ gait pattern.    Baseline Ankle strength grossly 4+/5 MMT (joint stiffness in L ankle)    Time 4    Period Weeks    Status Partially Met    Target Date 03/17/20      PT LONG TERM GOAL #4   Title Pt. will complete Berg balance test  and score >46/56 to improve safety/ decrease fall risk with appropriate assistive device.    Baseline TBD    Time 4    Period Weeks    Status On-going    Target Date 03/17/20      PT LONG TERM GOAL #5   Title Pt. able to ascend/ descend with recip. pattern and single handrail safely with no LOB to enter house safely.    Baseline Pt. currently UE assist on handrail and occasional wife assist.  Pt. fearful of falling on stairs.    Time 4    Period Weeks    Status New    Target Date 03/17/20            Plan - 02/23/20 1105    Clinical Impression Statement Pt improving in his ability to sense himself losing balance and begin to self-correct with stepping strategy. Pt still requires min assist on hips/shoulders to correct lateral LOB. Pt improving ability to clear RLE in hallway without using mirror as visual cue for proprioceptive deficits.  No recent falls over the past week.  PT will reassess FOTO and Merrilee Jansky goals next visit.  Pt can continue to benefit from skilled PT treatment to reduce falls with walking.    Stability/Clinical Decision Making Evolving/Moderate complexity    Clinical Decision Making Moderate    Rehab Potential Fair    PT Frequency 2x / week    PT Duration 4 weeks    PT Treatment/Interventions ADLs/Self Care Home Management;Aquatic Therapy;Cryotherapy;Electrical Stimulation;Moist Heat;Gait training;Stair training;Functional mobility training;Therapeutic activities;Therapeutic exercise;Balance training;Neuromuscular re-education;Manual techniques;Patient/family education;Passive range of motion;Dry needling    PT Next Visit Plan Stepping strategy.  Complete BERG and FOTO next tx.    PT Home Exercise Plan [facility]           Patient will benefit from skilled therapeutic intervention in order to improve the following deficits and impairments:  Abnormal gait, Decreased coordination, Decreased range of motion, Difficulty walking, Decreased endurance, Decreased activity  tolerance, Pain, Improper body mechanics, Impaired flexibility, Hypomobility, Postural dysfunction, Decreased strength  Visit Diagnosis: Foot drop, left  Joint stiffness  Muscle weakness (generalized)  Imbalance  Gait difficulty     Problem List Patient Active Problem List   Diagnosis Date Noted  . Benign prostatic hyperplasia 11/01/2019  . Elevated PSA 11/01/2019  . Gross hematuria 11/01/2019   Pura Spice, PT, DPT # [id number] SPT 02/23/2020, 3:24 PM  [facility] [address] [place], [place], [address] Phone: [phone]   Fax:  [phone]  Name: Andres Young MRN: [id number] Date of Birth: February 17, 1951

## 2020-02-25 ENCOUNTER — Other Ambulatory Visit: Payer: Self-pay

## 2020-02-25 ENCOUNTER — Encounter: Payer: Self-pay | Admitting: Physical Therapy

## 2020-02-25 ENCOUNTER — Ambulatory Visit: Payer: Medicare Other | Admitting: Physical Therapy

## 2020-02-25 DIAGNOSIS — M21372 Foot drop, left foot: Secondary | ICD-10-CM | POA: Diagnosis not present

## 2020-02-25 DIAGNOSIS — R269 Unspecified abnormalities of gait and mobility: Secondary | ICD-10-CM

## 2020-02-25 DIAGNOSIS — R2689 Other abnormalities of gait and mobility: Secondary | ICD-10-CM

## 2020-02-25 DIAGNOSIS — M2560 Stiffness of unspecified joint, not elsewhere classified: Secondary | ICD-10-CM

## 2020-02-25 DIAGNOSIS — M6281 Muscle weakness (generalized): Secondary | ICD-10-CM

## 2020-02-25 NOTE — Therapy (Signed)
[facility] [address] [place], [place], [address] Phone: [phone]   Fax:  [phone]  Physical Therapy Treatment  Patient Details  Name: Andres Young MRN: [id number] Date of Birth: 09/01/1950 Referring Provider (PT): Dr. Potter   Encounter Date: 02/25/2020   PT End of Session - 02/25/20 1249    Visit Number 9    Number of Visits 16    Date for PT Re-Evaluation 03/22/20    Authorization - Visit Number 2    Authorization - Number of Visits 10    PT Start Time 1115    PT Stop Time 1210    PT Time Calculation (min) 55 min    Activity Tolerance Patient tolerated treatment well;Patient limited by fatigue    Behavior During Therapy WFL for tasks assessed/performed           Past Medical History:  Diagnosis Date  . Cataract   . CIDP (chronic inflammatory demyelinating polyneuropathy) (HCC)   . Gout   . Guillain Barr syndrome (HCC)   . Heart attack (HCC)   . Heart disease   . Kidney stone   . Prostate enlargement   . Reflux gastritis     Past Surgical History:  Procedure Laterality Date  . CORONARY ANGIOPLASTY WITH STENT PLACEMENT    . EYE SURGERY      There were no vitals filed for this visit.   Subjective Assessment - 02/25/20 1248    Subjective No reports of falls or stumbles since previous session. No new c/o pain outside of pt's usual head aches he experiences.    Patient is accompained by: Family member    Pertinent History Pt. c/o numbness and tingling in LE extremities.  Difficulty sleeping and h/o falls due to foot drop/ weakness/ fatigue. H/o L ankle gout    Patient Stated Goals Improve LE strength/ balance and coordination    Currently in Pain? No/denies          There.ex:   Nu-Step L5 for 10 min. Use of UE's/LE's. Reassessed FOTO with spouse and pt while on nu-step.    Neuro Re-Ed:   Reassessed BERG balance score. Scored 48/56 today.  Assessed gait. Pt displays improved reciprocal gait  pattern with walking stick. Occasional decreased step length on LLE and decreased LLE foot clearance in swing phase. Continues to ambulate at decreased gait speed.   Pt able to safely asc/desc 4 stairs with single hand rail with reciprocal gait pattern. Pt reports occasional scuff of toe or heel at home when performing stairs. No longer requires wife assistance with stairs.     Manual therapy:   Hypervolt to B plantar surface of feet to improve circulation and sensation due to neuropathy. STM to B calves to reduce muscular tension.     PT Long Term Goals - 02/25/20 1253      PT LONG TERM GOAL #1   Title Pt. will increase FOTO to 50 to improve pain-free functional mobility.    Baseline Initial: 43; 02/25/2020: 43    Time 4    Period Weeks    Status On-going    Target Date 03/22/20      PT LONG TERM GOAL #2   Title Pt. able to ambulate community distances with least assistive device and no AFO with more normalized gait pattern to improve independence.    Baseline L antalgic gait pattern with use of walking stick.  Pt. has heel strike on L (no AFO needed at this   time).  7/6:  pt. limited by decrease endurance.  Pt. requires use of walking stick and extra time to safely ambulate.  Pt. showing progress towards goal.    Time 4    Period Weeks    Status Partially Met    Target Date 03/22/20      PT LONG TERM GOAL #3   Title Pt. independent with HEP to increase L ankle DF/PF to 5/5 MMT to improve standing tolerance/ gait pattern.    Baseline Ankle strength grossly 4+/5 MMT (joint stiffness in L ankle); 02/25/2020: 4+/5 DF, 5/5 PF on L ankle    Time 4    Period Weeks    Status Partially Met    Target Date 03/22/20      PT LONG TERM GOAL #4   Title Pt. will complete Berg balance test and score >46/56 to improve safety/ decrease fall risk with appropriate assistive device.    Baseline Initial: 27/56; 02/25/2020: 48/56    Time 4    Period Weeks    Status Achieved      PT LONG TERM GOAL #5    Title Pt. able to ascend/ descend with recip. pattern and single handrail safely with no LOB to enter house safely.    Baseline Pt. currently UE assist on handrail and occasional wife assist.  Pt. fearful of falling on stairs.02/25/2020: Asc/desc with single handrail. occasional scuff of toe/heel.    Time 4    Period Weeks    Status Partially Met    Target Date 03/22/20              Plan - 02/25/20 1250    Clinical Impression Statement BERG balance and FOTO were reassessed today. Pts FOTO score today scored a 43, which is exact score accomplished on initial eval. Pt accomplished BERG goal with an initial score of 27/56. Today his BERG scored improved to 48/56, however still places pt at risk for falls. Pt continues to display gait abnormalities and requires use of walking stick and has difficulty with dynamic balance tasks with redcued BOS such as reaching outside of BOS, SLS, and tandem stance. Pt can continue to benefit from skiled PT to further reduce risk of falls and to accomplish remaining goals that have not been met.    Stability/Clinical Decision Making Evolving/Moderate complexity    Rehab Potential Fair    PT Frequency 2x / week    PT Duration 4 weeks    PT Treatment/Interventions ADLs/Self Care Home Management;Aquatic Therapy;Cryotherapy;Electrical Stimulation;Moist Heat;Gait training;Stair training;Functional mobility training;Therapeutic activities;Therapeutic exercise;Balance training;Neuromuscular re-education;Manual techniques;Patient/family education;Passive range of motion;Dry needling    PT Next Visit Plan Reaching outside BOS, stepping strategy, Squatting.    PT Home Exercise Plan [id number]           Patient will benefit from skilled therapeutic intervention in order to improve the following deficits and impairments:  Abnormal gait, Decreased coordination, Decreased range of motion, Difficulty walking, Decreased endurance, Decreased activity tolerance, Pain, Improper body  mechanics, Impaired flexibility, Hypomobility, Postural dysfunction, Decreased strength  Visit Diagnosis: Foot drop, left  Joint stiffness  Muscle weakness (generalized)  Imbalance  Gait difficulty     Problem List Patient Active Problem List   Diagnosis Date Noted  . Benign prostatic hyperplasia 11/01/2019  . Elevated PSA 11/01/2019  . Gross hematuria 11/01/2019   Michael C Sherk, PT, DPT # [id number] Milton Fairly, SPT 02/25/2020, 1:40 PM  [facility] [address] [place], [place], [address]   Phone: [phone]   Fax:  [phone]  Name: Andres Young MRN: [id number] Date of Birth: 09/23/1950   

## 2020-03-03 ENCOUNTER — Other Ambulatory Visit: Payer: Self-pay

## 2020-03-03 ENCOUNTER — Encounter: Payer: Self-pay | Admitting: Physical Therapy

## 2020-03-03 ENCOUNTER — Ambulatory Visit: Payer: Medicare Other | Admitting: Physical Therapy

## 2020-03-03 DIAGNOSIS — M2560 Stiffness of unspecified joint, not elsewhere classified: Secondary | ICD-10-CM

## 2020-03-03 DIAGNOSIS — R269 Unspecified abnormalities of gait and mobility: Secondary | ICD-10-CM

## 2020-03-03 DIAGNOSIS — M21372 Foot drop, left foot: Secondary | ICD-10-CM | POA: Diagnosis not present

## 2020-03-03 DIAGNOSIS — R2689 Other abnormalities of gait and mobility: Secondary | ICD-10-CM

## 2020-03-03 DIAGNOSIS — M6281 Muscle weakness (generalized): Secondary | ICD-10-CM

## 2020-03-03 NOTE — Therapy (Signed)
[facility] [address] [place], [place], [address] Phone: [phone]   Fax:  [phone]  Physical Therapy Treatment  Patient Details  Name: Andres Young MRN: [id number] Date of Birth: 30-Mar-1951 Referring Provider (PT): Dr. Malvin Johns   Encounter Date: 03/03/2020   PT End of Session - 03/03/20 1423    Visit Number 10    Number of Visits 16    Date for PT Re-Evaluation 03/22/20    Authorization - Visit Number 3    Authorization - Number of Visits 10    PT Start Time 1335    PT Stop Time 1430    PT Time Calculation (min) 55 min    Equipment Utilized During Treatment Gait belt    Activity Tolerance Patient tolerated treatment well;Patient limited by fatigue    Behavior During Therapy WFL for tasks assessed/performed           Past Medical History:  Diagnosis Date   Cataract    CIDP (chronic inflammatory demyelinating polyneuropathy) (HCC)    Gout    Guillain Barr syndrome (HCC)    Heart attack (HCC)    Heart disease    Kidney stone    Prostate enlargement    Reflux gastritis     Past Surgical History:  Procedure Laterality Date   CORONARY ANGIOPLASTY WITH STENT PLACEMENT     EYE SURGERY      There were no vitals filed for this visit.   Subjective Assessment - 03/03/20 1421    Subjective Pt. reports no falls since last session. Pt. reports that stairs has been going well in the garage, and that R foot has been dragging a bit more today.    Patient is accompained by: Family member    Pertinent History Pt. c/o numbness and tingling in LE extremities.  Difficulty sleeping and h/o falls due to foot drop/ weakness/ fatigue. H/o L ankle gout    Patient Stated Goals Improve LE strength/ balance and coordination    Currently in Pain? No/denies           There Ex:  Nustep L4 10 mins: initially with arms and legs, last ~5 mins with just legs  STS: pt required cueing to improve hip hinge during sit. Pt  able to perform proper form with mod verbal cueing.  Neuro re-ed:  Cone reaches: pt completed cone reaches to improve hip and stepping strategies. Pt required CGA- mild assist to recover some LOB. With moderate verbal cueing pt. was to utilize hip and stepping strategy to reach cones  Hurdles in // bars: Pt initially walked forward over hurdles with CGA and hands on bars. Pt. progressed to side stepping over hurdles with CGA. Pt required mild assist to recover some LOB, was able to self correct most.   Manual:  10 mins theragun to bilat. feet in supine.       PT Long Term Goals - 02/25/20 1253      PT LONG TERM GOAL #1   Title Pt. will increase FOTO to 50 to improve pain-free functional mobility.    Baseline Initial: 43; 02/25/2020: 43    Time 4    Period Weeks    Status On-going    Target Date 03/22/20      PT LONG TERM GOAL #2   Title Pt. able to ambulate community distances with least assistive device and no AFO with more normalized gait pattern to improve independence.    Baseline L antalgic gait pattern with  use of walking stick.  Pt. has heel strike on L (no AFO needed at this time).  7/6:  pt. limited by decrease endurance.  Pt. requires use of walking stick and extra time to safely ambulate.  Pt. showing progress towards goal.    Time 4    Period Weeks    Status Partially Met    Target Date 03/22/20      PT LONG TERM GOAL #3   Title Pt. independent with HEP to increase L ankle DF/PF to 5/5 MMT to improve standing tolerance/ gait pattern.    Baseline Ankle strength grossly 4+/5 MMT (joint stiffness in L ankle); 02/25/2020: 4+/5 DF, 5/5 PF on L ankle    Time 4    Period Weeks    Status Partially Met    Target Date 03/22/20      PT LONG TERM GOAL #4   Title Pt. will complete Berg balance test and score >46/56 to improve safety/ decrease fall risk with appropriate assistive device.    Baseline Initial: 27/56; 02/25/2020: 48/56    Time 4    Period Weeks    Status Achieved       PT LONG TERM GOAL #5   Title Pt. able to ascend/ descend with recip. pattern and single handrail safely with no LOB to enter house safely.    Baseline Pt. currently UE assist on handrail and occasional wife assist.  Pt. fearful of falling on stairs.02/25/2020: Asc/desc with single handrail. occasional scuff of toe/heel.    Time 4    Period Weeks    Status Partially Met    Target Date 03/22/20                 Plan - 03/03/20 1423    Clinical Impression Statement Pt. demonstrates improved ability to perform hip strategy for balance and improved STS technique. Pt. able to complete stepping and side stepping over hurdles in // bars with CGA. Pt. completed standing cone reaches to practice hip or stepping strategy to correct LOB. Pt still heavily relies on ankle strategies to attempt to recover balance, but with moderate verbal cueing pt. is able to utilize hip and/or stepping strategy. Pt. will continue to benefit from skilled PT to address balance impairments to reduce falls risk.    Stability/Clinical Decision Making Evolving/Moderate complexity    Clinical Decision Making Moderate    Rehab Potential Fair    PT Frequency 2x / week    PT Duration 4 weeks    PT Treatment/Interventions ADLs/Self Care Home Management;Aquatic Therapy;Cryotherapy;Electrical Stimulation;Moist Heat;Gait training;Stair training;Functional mobility training;Therapeutic activities;Therapeutic exercise;Balance training;Neuromuscular re-education;Manual techniques;Patient/family education;Passive range of motion;Dry needling    PT Next Visit Plan Reaching outside BOS, stepping strategy, Squatting.    PT Home Exercise Plan [facility]           Patient will benefit from skilled therapeutic intervention in order to improve the following deficits and impairments:  Abnormal gait, Decreased coordination, Decreased range of motion, Difficulty walking, Decreased endurance, Decreased activity tolerance, Pain, Improper body  mechanics, Impaired flexibility, Hypomobility, Postural dysfunction, Decreased strength  Visit Diagnosis: Foot drop, left  Joint stiffness  Muscle weakness (generalized)  Imbalance  Gait difficulty     Problem List Patient Active Problem List   Diagnosis Date Noted   Benign prostatic hyperplasia 11/01/2019   Elevated PSA 11/01/2019   Gross hematuria 11/01/2019   Cammie Mcgee, PT, DPT # [id number] Sharyn Creamer, SPT 03/04/2020, 7:30 AM  [facility]  [facility] [address] [place], [place], [address] Phone: [phone]   Fax:  [phone]  Name: Jaxin Fulfer MRN: [id number] Date of Birth: 14-Sep-1950

## 2020-03-07 ENCOUNTER — Other Ambulatory Visit: Payer: Self-pay

## 2020-03-07 ENCOUNTER — Encounter: Payer: Self-pay | Admitting: Physical Therapy

## 2020-03-07 ENCOUNTER — Ambulatory Visit: Payer: Medicare Other | Admitting: Physical Therapy

## 2020-03-07 DIAGNOSIS — R269 Unspecified abnormalities of gait and mobility: Secondary | ICD-10-CM

## 2020-03-07 DIAGNOSIS — M6281 Muscle weakness (generalized): Secondary | ICD-10-CM

## 2020-03-07 DIAGNOSIS — M21372 Foot drop, left foot: Secondary | ICD-10-CM | POA: Diagnosis not present

## 2020-03-07 DIAGNOSIS — M2560 Stiffness of unspecified joint, not elsewhere classified: Secondary | ICD-10-CM

## 2020-03-07 DIAGNOSIS — R2689 Other abnormalities of gait and mobility: Secondary | ICD-10-CM

## 2020-03-07 NOTE — Therapy (Signed)
[facility] [address] [place], [place], [address] Phone: [phone]   Fax:  [phone]  Physical Therapy Treatment  Patient Details  Name: Andres Young MRN: [id number] Date of Birth: 10/05/1950 Referring Provider (PT): Dr. Melrose Nakayama   Encounter Date: 03/07/2020   PT End of Session - 03/07/20 1144    Visit Number 11    Number of Visits 16    Date for PT Re-Evaluation 03/22/20    Authorization - Visit Number 4    Authorization - Number of Visits 10    PT Start Time 0945    PT Stop Time 1045    PT Time Calculation (min) 60 min    Equipment Utilized During Treatment Gait belt    Activity Tolerance Patient tolerated treatment well;No increased pain    Behavior During Therapy WFL for tasks assessed/performed           Past Medical History:  Diagnosis Date  . Cataract   . CIDP (chronic inflammatory demyelinating polyneuropathy) ([place])   . Gout   . Guillain Barr syndrome ([place])   . Heart attack ([place])   . Heart disease   . Kidney stone   . Prostate enlargement   . Reflux gastritis     Past Surgical History:  Procedure Laterality Date  . CORONARY ANGIOPLASTY WITH STENT PLACEMENT    . EYE SURGERY      There were no vitals filed for this visit.   Subjective Assessment - 03/07/20 1142    Subjective Pt continues to report no falls. Pt and spouse states he has been scuffing R foot more when ambualting community distancees most likely due to fatigue but also the dual task component of focusing on the errand they are running. However, although foot is scuffing he is able to correct with no problems or LOB. Spouse more comfortable in not being as hands on for pt and reports improvements in balance and cognition and personaility after medication adjustments ~3 weeks ago.    Patient is accompained by: Family member    Pertinent History Pt. c/o numbness and tingling in LE extremities.  Difficulty sleeping and h/o falls due to foot  drop/ weakness/ fatigue. H/o L ankle gout    Patient Stated Goals Improve LE strength/ balance and coordination    Currently in Pain? No/denies          Neuro Re-Ed:   In hallway (68' one way): Horizontal head turns/vertical head turns/ fast walking to slow walking/dual task of naming grocery store items. Only noted increased difficulty with horizontal and vertical head turns. Noted mild gait deviations with inconsistent B step-length and lateral sway but pt able to self correct:  In hallway: 68'x2, reciprocal marching with arm taps on knees for improving reciprocal gait pattern, foot clearance in swing phase and coordination. Minimal sway or stepping to correct LOB.   // bars:  B SLS on airex pad: 3x5 sec holds. More difficult on R > L with noted lateral sway and need of BUE support on bars to correct.  Tandem stance x4 with min verbal cues for upright posture and use of mirror as visual feedback   Forward dynamic reach with cones: 2x15 cones with focus on use of ankle/hip/ankle strategy and safe squat technique to receive cones   Manual Therapy: 12 min   Pt in supine and use of hypervolt to B plantar surface of feet for temporary improvements of sensation in feet 2/2 neuropathy. B hypervolt STM to [employer]  to reduce muscular tension.   PT Long Term Goals - 02/25/20 1253      PT LONG TERM GOAL #1   Title Pt. will increase FOTO to 50 to improve pain-free functional mobility.    Baseline Initial: 43; 02/25/2020: 43    Time 4    Period Weeks    Status On-going    Target Date 03/22/20      PT LONG TERM GOAL #2   Title Pt. able to ambulate community distances with least assistive device and no AFO with more normalized gait pattern to improve independence.    Baseline L antalgic gait pattern with use of walking stick.  Pt. has heel strike on L (no AFO needed at this time).  7/6:  pt. limited by decrease endurance.  Pt. requires use of walking stick and extra time to safely ambulate.  Pt.  showing progress towards goal.    Time 4    Period Weeks    Status Partially Met    Target Date 03/22/20      PT LONG TERM GOAL #3   Title Pt. independent with HEP to increase L ankle DF/PF to 5/5 MMT to improve standing tolerance/ gait pattern.    Baseline Ankle strength grossly 4+/5 MMT (joint stiffness in L ankle); 02/25/2020: 4+/5 DF, 5/5 PF on L ankle    Time 4    Period Weeks    Status Partially Met    Target Date 03/22/20      PT LONG TERM GOAL #4   Title Pt. will complete Berg balance test and score >46/56 to improve safety/ decrease fall risk with appropriate assistive device.    Baseline Initial: 27/56; 02/25/2020: 48/56    Time 4    Period Weeks    Status Achieved      PT LONG TERM GOAL #5   Title Pt. able to ascend/ descend with recip. pattern and single handrail safely with no LOB to enter house safely.    Baseline Pt. currently UE assist on handrail and occasional wife assist.  Pt. fearful of falling on stairs.02/25/2020: Asc/desc with single handrail. occasional scuff of toe/heel.    Time 4    Period Weeks    Status Partially Met    Target Date 03/22/20                 Plan - 03/07/20 1144    Clinical Impression Statement Pt continues to appear more upbeat and lively with therapy. Pt is making continuous improvements in his coordiation abilities, clearing his RLE during gait in swing phase with intermittment verbal cues. Pt able to utilize proper hip and ankle and occasional ankle strategy with forward dynamic reaching outside BOS. Pt required min corrections for LOB with forward reaching but othertimes pt is able to correct balance on his own without therapist assist. Pt continues to have no falls at home.    Stability/Clinical Decision Making Evolving/Moderate complexity    Rehab Potential Fair    PT Frequency 2x / week    PT Duration 4 weeks    PT Treatment/Interventions ADLs/Self Care Home Management;Aquatic Therapy;Cryotherapy;Electrical Stimulation;Moist  Heat;Gait training;Stair training;Functional mobility training;Therapeutic activities;Therapeutic exercise;Balance training;Neuromuscular re-education;Manual techniques;Patient/family education;Passive range of motion;Dry needling    PT Next Visit Plan Reaching outside BOS, stepping strategy, Squatting.    PT Home Exercise Plan [facility]           Patient will benefit from skilled therapeutic intervention in order to improve the following deficits and impairments:  Abnormal  gait, Decreased coordination, Decreased range of motion, Difficulty walking, Decreased endurance, Decreased activity tolerance, Pain, Improper body mechanics, Impaired flexibility, Hypomobility, Postural dysfunction, Decreased strength  Visit Diagnosis: Foot drop, left  Joint stiffness  Muscle weakness (generalized)  Imbalance  Gait difficulty     Problem List Patient Active Problem List   Diagnosis Date Noted  . Benign prostatic hyperplasia 11/01/2019  . Elevated PSA 11/01/2019  . Gross hematuria 11/01/2019   Pura Spice, PT, DPT # [id number], SPT 03/08/2020, 9:06 AM  [facility] [address] [place], [place], [address] Phone: [phone]   Fax:  [phone]  Name: Taysom Glymph MRN: [id number] Date of Birth: July 17, 1951

## 2020-03-15 ENCOUNTER — Other Ambulatory Visit: Payer: Self-pay

## 2020-03-15 ENCOUNTER — Ambulatory Visit: Payer: Medicare Other | Admitting: Physical Therapy

## 2020-03-15 ENCOUNTER — Encounter: Payer: Self-pay | Admitting: Physical Therapy

## 2020-03-15 DIAGNOSIS — R269 Unspecified abnormalities of gait and mobility: Secondary | ICD-10-CM

## 2020-03-15 DIAGNOSIS — R2689 Other abnormalities of gait and mobility: Secondary | ICD-10-CM

## 2020-03-15 DIAGNOSIS — M6281 Muscle weakness (generalized): Secondary | ICD-10-CM

## 2020-03-15 DIAGNOSIS — M21372 Foot drop, left foot: Secondary | ICD-10-CM | POA: Diagnosis not present

## 2020-03-15 DIAGNOSIS — M2560 Stiffness of unspecified joint, not elsewhere classified: Secondary | ICD-10-CM

## 2020-03-15 NOTE — Therapy (Signed)
[facility] [address] [place], [place], [address] Phone: [phone]   Fax:  [phone]  Physical Therapy Treatment  Patient Details  Name: Andres Young MRN: [id number] Date of Birth: 22-Sep-1950 Referring Provider (PT): Dr. Melrose Nakayama   Encounter Date: 03/15/2020   PT End of Session - 03/15/20 1603    Visit Number 12    Number of Visits 16    Date for PT Re-Evaluation 03/22/20    Authorization - Visit Number 5    Authorization - Number of Visits 10    PT Start Time 9417    PT Stop Time 1432    PT Time Calculation (min) 44 min    Equipment Utilized During Treatment Gait belt    Activity Tolerance Patient tolerated treatment well;No increased pain    Behavior During Therapy WFL for tasks assessed/performed           Past Medical History:  Diagnosis Date  . Cataract   . CIDP (chronic inflammatory demyelinating polyneuropathy) ([place])   . Gout   . Guillain Barr syndrome ([place])   . Heart attack ([place])   . Heart disease   . Kidney stone   . Prostate enlargement   . Reflux gastritis     Past Surgical History:  Procedure Laterality Date  . CORONARY ANGIOPLASTY WITH STENT PLACEMENT    . EYE SURGERY      There were no vitals filed for this visit.   Subjective Assessment - 03/15/20 1602    Subjective Pt's spouse reports pt is having an "off day" after receiving bad news from doctor's appointment last week and she believes it's playing a mental component. No reports of falls however.    Patient is accompained by: Family member    Pertinent History Pt. c/o numbness and tingling in LE extremities.  Difficulty sleeping and h/o falls due to foot drop/ weakness/ fatigue. H/o L ankle gout    Patient Stated Goals Improve LE strength/ balance and coordination    Currently in Pain? No/denies    Pain Score 0-No pain    Multiple Pain Sites No         There.ex:   STS: 1x10. Pt able to scoot forward in seat and stand with no  verbal cues and no use of BUE's to push up.  Neuro Re-Ed:   Stepping over hurdles alternating from small to tall: x4. Difficulty with L DF and knocking over multiple hurdles (tall and small). Use of 3lbs ankle weights and mirror to improve pt's proprioceptive feedback to improve foot clearance and DF. X4. Significant improvement in L foot clearance and L DF over all hurdles without knocking over any hurdles.   Multiplanar cone taps on L/R LE's: x3/LE. Tactile placement of plantar surface of feet to improve DF   Manual therapy:   STM  With hypervolt with pt in supine to B gastroc to reduce muscle tension to improve BLE DF AROM and to plantar surface of feet to temporarily improve sensation and pain 2/2 polyneuropathy.   PT Long Term Goals - 02/25/20 1253      PT LONG TERM GOAL #1   Title Pt. will increase FOTO to 50 to improve pain-free functional mobility.    Baseline Initial: 43; 02/25/2020: 43    Time 4    Period Weeks    Status On-going    Target Date 03/22/20      PT LONG TERM GOAL #2   Title Pt. able to ambulate community  distances with least assistive device and no AFO with more normalized gait pattern to improve independence.    Baseline L antalgic gait pattern with use of walking stick.  Pt. has heel strike on L (no AFO needed at this time).  7/6:  pt. limited by decrease endurance.  Pt. requires use of walking stick and extra time to safely ambulate.  Pt. showing progress towards goal.    Time 4    Period Weeks    Status Partially Met    Target Date 03/22/20      PT LONG TERM GOAL #3   Title Pt. independent with HEP to increase L ankle DF/PF to 5/5 MMT to improve standing tolerance/ gait pattern.    Baseline Ankle strength grossly 4+/5 MMT (joint stiffness in L ankle); 02/25/2020: 4+/5 DF, 5/5 PF on L ankle    Time 4    Period Weeks    Status Partially Met    Target Date 03/22/20      PT LONG TERM GOAL #4   Title Pt. will complete Berg balance test and score >46/56 to  improve safety/ decrease fall risk with appropriate assistive device.    Baseline Initial: 27/56; 02/25/2020: 48/56    Time 4    Period Weeks    Status Achieved      PT LONG TERM GOAL #5   Title Pt. able to ascend/ descend with recip. pattern and single handrail safely with no LOB to enter house safely.    Baseline Pt. currently UE assist on handrail and occasional wife assist.  Pt. fearful of falling on stairs.02/25/2020: Asc/desc with single handrail. occasional scuff of toe/heel.    Time 4    Period Weeks    Status Partially Met    Target Date 03/22/20             Plan - 03/15/20 1603    Clinical Impression Statement Pt having increased difficulty of raising L foot into DF to clear small and large hurdles today. 3 lbs ankle weights placed on pt and use of mirro to improve proprioceptive feedback. Pt significantly improved ability to DF over hurdles without knocking hurdles over. Use of cones in multiple planes as tactile cue to encourage pt to improve DF of L foot by having to tap bottom of shoe on top of the cones. Pt displays great carryover with STS stechnique not requiring use of BUE's for support.    Stability/Clinical Decision Making Evolving/Moderate complexity    Rehab Potential Fair    PT Frequency 2x / week    PT Duration 4 weeks    PT Treatment/Interventions ADLs/Self Care Home Management;Aquatic Therapy;Cryotherapy;Electrical Stimulation;Moist Heat;Gait training;Stair training;Functional mobility training;Therapeutic activities;Therapeutic exercise;Balance training;Neuromuscular re-education;Manual techniques;Patient/family education;Passive range of motion;Dry needling    PT Next Visit Plan Coordiation of BLE's. Ankle weights as proprioceptive feedback to improve L foot DF    PT Home Exercise Plan [facility]           Patient will benefit from skilled therapeutic intervention in order to improve the following deficits and impairments:  Abnormal gait, Decreased coordination,  Decreased range of motion, Difficulty walking, Decreased endurance, Decreased activity tolerance, Pain, Improper body mechanics, Impaired flexibility, Hypomobility, Postural dysfunction, Decreased strength  Visit Diagnosis: Foot drop, left  Joint stiffness  Muscle weakness (generalized)  Imbalance  Gait difficulty     Problem List Patient Active Problem List   Diagnosis Date Noted  . Benign prostatic hyperplasia 11/01/2019  . Elevated PSA 11/01/2019  . Gross hematuria  11/01/2019   Pura Spice, PT, DPT # [id number] SPT 03/16/2020, 7:07 AM  [place] [facility] [address] [place], [place], [address] Phone: [phone]   Fax:  [phone]  Name: Andres Young MRN: [id number] Date of Birth: 1951-04-24

## 2020-03-17 ENCOUNTER — Other Ambulatory Visit: Payer: Self-pay

## 2020-03-17 ENCOUNTER — Ambulatory Visit: Payer: Medicare Other

## 2020-03-17 ENCOUNTER — Encounter: Payer: Self-pay | Admitting: Physical Therapy

## 2020-03-17 DIAGNOSIS — M21372 Foot drop, left foot: Secondary | ICD-10-CM

## 2020-03-17 DIAGNOSIS — M2560 Stiffness of unspecified joint, not elsewhere classified: Secondary | ICD-10-CM

## 2020-03-17 DIAGNOSIS — R269 Unspecified abnormalities of gait and mobility: Secondary | ICD-10-CM

## 2020-03-17 DIAGNOSIS — R2689 Other abnormalities of gait and mobility: Secondary | ICD-10-CM

## 2020-03-17 DIAGNOSIS — M6281 Muscle weakness (generalized): Secondary | ICD-10-CM

## 2020-03-17 NOTE — Therapy (Signed)
[facility] [address] [place], [place], [address] Phone: [phone]   Fax:  [phone]  Physical Therapy Treatment  Patient Details  Name: Andres Young MRN: [id number] Date of Birth: 01-Mar-1951 Referring Provider (PT): Dr. Melrose Nakayama   Encounter Date: 03/17/2020   PT End of Session - 03/17/20 1542    Visit Number 13    Number of Visits 16    Date for PT Re-Evaluation 03/22/20    Authorization - Visit Number 6    Authorization - Number of Visits 10    PT Start Time 3474    PT Stop Time 1522    PT Time Calculation (min) 48 min    Equipment Utilized During Treatment Gait belt    Activity Tolerance Patient tolerated treatment well;No increased pain    Behavior During Therapy WFL for tasks assessed/performed           Past Medical History:  Diagnosis Date   Cataract    CIDP (chronic inflammatory demyelinating polyneuropathy) ([facility])    Gout    Guillain Barr syndrome ([place])    Heart attack ([place])    Heart disease    Kidney stone    Prostate enlargement    Reflux gastritis     Past Surgical History:  Procedure Laterality Date   CORONARY ANGIOPLASTY WITH STENT PLACEMENT     EYE SURGERY      There were no vitals filed for this visit.   Subjective Assessment - 03/17/20 1541    Subjective Pt reports "feeling better today". Safely able to asc/desc stairs with R railing with no issues. No falls or stumbles have occurred since previous session.    Patient is accompained by: Family member    Pertinent History Pt. c/o numbness and tingling in LE extremities.  Difficulty sleeping and h/o falls due to foot drop/ weakness/ fatigue. H/o L ankle gout    Patient Stated Goals Improve LE strength/ balance and coordination    Currently in Pain? No/denies    Pain Score 0-No pain    Multiple Pain Sites No          There.ex:  Squat to stand placing 20 lbs box from floor to counter: x8 with good form/technique   Neuro  Re-Ed:  Obstacle course: amb over unstable surface (blue mat), stepping over alternating hurdle height (small/tall), Step-ups onto 6" step (x5 R,x5 L), Baps board fwd/bckwd x5/direction: x4. Improved ability to DF L foot and clear all hurdles and step ups with no difficulty or LOB. Standing perturbations in // bars at hips and shoulders to challenge ankle/hip strategy: x5 min Standing on top of BOSU ball with ball toss as perturbation: x2 min in multiple planes to challenge ankle/hip strategy  Standing on bosu ball with ball toss as perturbation with feet together: x2 min in multiple planes to challenge ankle/hip strategy.  Manual Therapy:  STM with hypervolt to B gastroc/soleus complex for 2 min/each. Hypervolt to plantar surface of B feet to temporarily improve sensation to feet 2/2 polyneuropathy for 2 min/each.   PT Long Term Goals - 02/25/20 1253      PT LONG TERM GOAL #1   Title Pt. will increase FOTO to 50 to improve pain-free functional mobility.    Baseline Initial: 43; 02/25/2020: 43    Time 4    Period Weeks    Status On-going    Target Date 03/22/20      PT LONG TERM GOAL #2   Title Pt. able  to ambulate community distances with least assistive device and no AFO with more normalized gait pattern to improve independence.    Baseline L antalgic gait pattern with use of walking stick.  Pt. has heel strike on L (no AFO needed at this time).  7/6:  pt. limited by decrease endurance.  Pt. requires use of walking stick and extra time to safely ambulate.  Pt. showing progress towards goal.    Time 4    Period Weeks    Status Partially Met    Target Date 03/22/20      PT LONG TERM GOAL #3   Title Pt. independent with HEP to increase L ankle DF/PF to 5/5 MMT to improve standing tolerance/ gait pattern.    Baseline Ankle strength grossly 4+/5 MMT (joint stiffness in L ankle); 02/25/2020: 4+/5 DF, 5/5 PF on L ankle    Time 4    Period Weeks    Status Partially Met    Target Date 03/22/20       PT LONG TERM GOAL #4   Title Pt. will complete Berg balance test and score >46/56 to improve safety/ decrease fall risk with appropriate assistive device.    Baseline Initial: 27/56; 02/25/2020: 48/56    Time 4    Period Weeks    Status Achieved      PT LONG TERM GOAL #5   Title Pt. able to ascend/ descend with recip. pattern and single handrail safely with no LOB to enter house safely.    Baseline Pt. currently UE assist on handrail and occasional wife assist.  Pt. fearful of falling on stairs.02/25/2020: Asc/desc with single handrail. occasional scuff of toe/heel.    Time 4    Period Weeks    Status Partially Met    Target Date 03/22/20                 Plan - 03/17/20 1543    Clinical Impression Statement Pt able to perform high level dynamic balance tasks of ambulating over hurdles, walking over unstable surfaces, asc/desc curbs and respond to perturbations with ankle/hip/stepping strategies safely with no LOB. Pt demonstrates ability to safely squat down with good mechanics with no LOB and lift 20 lbs box multiple times. Pt improving well with balance and ability to maintain L foot DF during gait and stepping tasks with no scuffing and LOB. Pt only had minor sway and LOB on top of Bosu ball intermittently requiring minA+1 from therapist to correct.    Stability/Clinical Decision Making Evolving/Moderate complexity    Rehab Potential Fair    PT Frequency 2x / week    PT Duration 4 weeks    PT Treatment/Interventions ADLs/Self Care Home Management;Aquatic Therapy;Cryotherapy;Electrical Stimulation;Moist Heat;Gait training;Stair training;Functional mobility training;Therapeutic activities;Therapeutic exercise;Balance training;Neuromuscular re-education;Manual techniques;Patient/family education;Passive range of motion;Dry needling    PT Next Visit Plan [place]           Patient will benefit from skilled therapeutic intervention in order to  improve the following deficits and impairments:  Abnormal gait, Decreased coordination, Decreased range of motion, Difficulty walking, Decreased endurance, Decreased activity tolerance, Pain, Improper body mechanics, Impaired flexibility, Hypomobility, Postural dysfunction, Decreased strength  Visit Diagnosis: Foot drop, left  Joint stiffness  Muscle weakness (generalized)  Imbalance  Gait difficulty     Problem List Patient Active Problem List   Diagnosis Date Noted   Benign prostatic hyperplasia 11/01/2019   Elevated PSA 11/01/2019   Gross hematuria 11/01/2019  Larna Daughters, SPT 03/17/2020, 3:48 PM Merdis Delay, PT, DPT Physical Therapist - [place]  [facility] [address] [place], [place], [address] Phone: [phone]   Fax:  [phone]  Name: Rylie Knierim MRN: [id number] Date of Birth: 1951-02-04

## 2020-03-22 ENCOUNTER — Other Ambulatory Visit: Payer: Self-pay

## 2020-03-22 ENCOUNTER — Encounter: Payer: Self-pay | Admitting: Physical Therapy

## 2020-03-22 ENCOUNTER — Ambulatory Visit: Payer: Medicare Other | Attending: Neurology | Admitting: Physical Therapy

## 2020-03-22 DIAGNOSIS — M2560 Stiffness of unspecified joint, not elsewhere classified: Secondary | ICD-10-CM | POA: Diagnosis present

## 2020-03-22 DIAGNOSIS — M21372 Foot drop, left foot: Secondary | ICD-10-CM | POA: Diagnosis present

## 2020-03-22 DIAGNOSIS — M6281 Muscle weakness (generalized): Secondary | ICD-10-CM | POA: Insufficient documentation

## 2020-03-22 DIAGNOSIS — R2689 Other abnormalities of gait and mobility: Secondary | ICD-10-CM | POA: Insufficient documentation

## 2020-03-22 DIAGNOSIS — R269 Unspecified abnormalities of gait and mobility: Secondary | ICD-10-CM | POA: Insufficient documentation

## 2020-03-22 NOTE — Therapy (Signed)
[facility] [address] [place], [place], [address] Phone: [phone]   Fax:  [phone]  Physical Therapy Treatment  Patient Details  Name: Andres Young MRN: [id number] Date of Birth: 24-Oct-1950 Referring Provider (PT): Dr. Melrose Nakayama   Encounter Date: 03/22/2020   PT End of Session - 03/22/20 1450    Visit Number 14    Number of Visits 16    Date for PT Re-Evaluation 03/22/20    Authorization - Visit Number 7    Authorization - Number of Visits 10    PT Start Time 1350    PT Stop Time 1444    PT Time Calculation (min) 54 min    Equipment Utilized During Treatment Gait belt    Activity Tolerance Patient tolerated treatment well;No increased pain    Behavior During Therapy WFL for tasks assessed/performed           Past Medical History:  Diagnosis Date  . Cataract   . CIDP (chronic inflammatory demyelinating polyneuropathy) ([place])   . Gout   . Guillain Barr syndrome ([place])   . Heart attack ([place])   . Heart disease   . Kidney stone   . Prostate enlargement   . Reflux gastritis     Past Surgical History:  Procedure Laterality Date  . CORONARY ANGIOPLASTY WITH STENT PLACEMENT    . EYE SURGERY      There were no vitals filed for this visit.   Subjective Assessment - 03/22/20 1449    Subjective Pt and spouse believe pt is ready to be D/C'd after this week.Pt continues to state he is having no falls and when he scuffs his L foot walking, he is able to step to correct his balance with no issues.    Patient is accompained by: Family member    Pertinent History Pt. c/o numbness and tingling in LE extremities.  Difficulty sleeping and h/o falls due to foot drop/ weakness/ fatigue. H/o L ankle gout    Patient Stated Goals Improve LE strength/ balance and coordination    Currently in Pain? No/denies    Pain Score 0-No pain    Multiple Pain Sites No          There.ex:  Nu-Step L5 for 10 min. Use of UE's/LE's. Dicussed  D/C and current challenges pt still has with his impairments with pt and spouse  Neuro Re-Ed:  Ambulated outside on grass, asc/desc hills, asc/desc staircase with use of SPC. Total of 1 lap around building of PT clinic. Pt displayed ability to consistently heel strike on LLE with consistent DF during swing phase of gait. Capable of performing dual task of maintaining conversation during gait with no decrease in gait speed and no lat or post sway and no LOB. Pt safe asc/desc stairs with supervision reciprocal pattern with walking stick and single UE on railing.   B lunge onto BOSU ball to challenge ankle stability: 2x12   Standing on BOSU ball doing ball toss in multiple planes with CGA+1 for safety. 2x3 min to improve ankle/hip strategy  Walking over alternate hurdle heights (small/tall) to improve DF during swing phase of gait to reduce scuffing of foot: x4   Manual Therapy:  STM with hypervolt with pt in supine to B plantar surface of feet to temporarily improve pt's sensation in feet 2/2 polyneuropathy. 8 min total.  STM with hypervolt with pt in supine to gastroc/soleus complex to reduce muscular tension to improve B DF for improved swing phase  during gait. 7 min total.    PT Long Term Goals - 02/25/20 1253      PT LONG TERM GOAL #1   Title Pt. will increase FOTO to 50 to improve pain-free functional mobility.    Baseline Initial: 43; 02/25/2020: 43    Time 4    Period Weeks    Status On-going    Target Date 03/22/20      PT LONG TERM GOAL #2   Title Pt. able to ambulate community distances with least assistive device and no AFO with more normalized gait pattern to improve independence.    Baseline L antalgic gait pattern with use of walking stick.  Pt. has heel strike on L (no AFO needed at this time).  7/6:  pt. limited by decrease endurance.  Pt. requires use of walking stick and extra time to safely ambulate.  Pt. showing progress towards goal.    Time 4    Period Weeks    Status  Partially Met    Target Date 03/22/20      PT LONG TERM GOAL #3   Title Pt. independent with HEP to increase L ankle DF/PF to 5/5 MMT to improve standing tolerance/ gait pattern.    Baseline Ankle strength grossly 4+/5 MMT (joint stiffness in L ankle); 02/25/2020: 4+/5 DF, 5/5 PF on L ankle    Time 4    Period Weeks    Status Partially Met    Target Date 03/22/20      PT LONG TERM GOAL #4   Title Pt. will complete Berg balance test and score >46/56 to improve safety/ decrease fall risk with appropriate assistive device.    Baseline Initial: 27/56; 02/25/2020: 48/56    Time 4    Period Weeks    Status Achieved      PT LONG TERM GOAL #5   Title Pt. able to ascend/ descend with recip. pattern and single handrail safely with no LOB to enter house safely.    Baseline Pt. currently UE assist on handrail and occasional wife assist.  Pt. fearful of falling on stairs.02/25/2020: Asc/desc with single handrail. occasional scuff of toe/heel.    Time 4    Period Weeks    Status Partially Met    Target Date 03/22/20            Plan - 03/22/20 1450    Clinical Impression Statement Pt demonstrated ability to ambulate outdoors around clinic with walking stick with consistent L heel strike and clearing of L foot during walking with equal step length. Pt demonstrated ability to maintain conversation while ambulating outside and also ability to horizontally turn head L/R with no LOB or changes in gait speed. Pt displayed improvements in ability to balance on BOSU ball and ball toss for added perturbation with ability to use hip/ankle strategy to correct sway.    Stability/Clinical Decision Making Evolving/Moderate complexity    Clinical Decision Making Moderate    Rehab Potential Fair    PT Frequency 2x / week    PT Duration 4 weeks    PT Treatment/Interventions ADLs/Self Care Home Management;Aquatic Therapy;Cryotherapy;Electrical Stimulation;Moist Heat;Gait training;Stair training;Functional mobility  training;Therapeutic activities;Therapeutic exercise;Balance training;Neuromuscular re-education;Manual techniques;Patient/family education;Passive range of motion;Dry needling    PT Next Visit Plan Discharge next visit.    PT Home Exercise Plan [facility]           Patient will benefit from skilled therapeutic intervention in order to improve the following deficits and impairments:  Abnormal gait, Decreased  coordination, Decreased range of motion, Difficulty walking, Decreased endurance, Decreased activity tolerance, Pain, Improper body mechanics, Impaired flexibility, Hypomobility, Postural dysfunction, Decreased strength  Visit Diagnosis: Foot drop, left  Joint stiffness  Imbalance  Muscle weakness (generalized)  Gait difficulty     Problem List Patient Active Problem List   Diagnosis Date Noted  . Benign prostatic hyperplasia 11/01/2019  . Elevated PSA 11/01/2019  . Gross hematuria 11/01/2019   Pura Spice, PT, DPT # [id number], SPT 03/23/2020, 9:40 AM  [place] [facility] [address] [place], [place], [address] Phone: [phone]   Fax:  [phone]  Name: Andres Young MRN: [id number] Date of Birth: 1951-02-10

## 2020-03-24 ENCOUNTER — Other Ambulatory Visit: Payer: Self-pay

## 2020-03-24 ENCOUNTER — Ambulatory Visit: Payer: Medicare Other | Admitting: Physical Therapy

## 2020-03-24 ENCOUNTER — Encounter: Payer: Self-pay | Admitting: Physical Therapy

## 2020-03-24 DIAGNOSIS — M6281 Muscle weakness (generalized): Secondary | ICD-10-CM

## 2020-03-24 DIAGNOSIS — M2560 Stiffness of unspecified joint, not elsewhere classified: Secondary | ICD-10-CM

## 2020-03-24 DIAGNOSIS — R2689 Other abnormalities of gait and mobility: Secondary | ICD-10-CM

## 2020-03-24 DIAGNOSIS — M21372 Foot drop, left foot: Secondary | ICD-10-CM | POA: Diagnosis not present

## 2020-03-24 DIAGNOSIS — R269 Unspecified abnormalities of gait and mobility: Secondary | ICD-10-CM

## 2020-03-24 NOTE — Therapy (Signed)
[facility] [address] [place], [place], [address] Phone: [phone]   Fax:  [phone]  Physical Therapy Treatment/Discharge  Patient Details  Name: Andres Young MRN: [id number] Date of Birth: 22-Mar-1951 Referring Provider (PT): Dr. Malvin Johns   Encounter Date: 03/24/2020   PT End of Session - 03/24/20 1526    Visit Number 15    Number of Visits 16    Date for PT Re-Evaluation 03/24/20    Authorization - Visit Number 1    Authorization - Number of Visits 10    PT Start Time 1431    PT Stop Time 1521    PT Time Calculation (min) 50 min    Equipment Utilized During Treatment Gait belt    Activity Tolerance Patient tolerated treatment well;No increased pain    Behavior During Therapy WFL for tasks assessed/performed           Past Medical History:  Diagnosis Date  . Cataract   . CIDP (chronic inflammatory demyelinating polyneuropathy) (HCC)   . Gout   . Guillain Barr syndrome (HCC)   . Heart attack (HCC)   . Heart disease   . Kidney stone   . Prostate enlargement   . Reflux gastritis     Past Surgical History:  Procedure Laterality Date  . CORONARY ANGIOPLASTY WITH STENT PLACEMENT    . EYE SURGERY      There were no vitals filed for this visit.   Subjective Assessment - 03/24/20 1525    Subjective Pt and spouse report they believe pt is ready for D/c. No concerns or pain reported. Pt has continued to have no falls.    Patient is accompained by: Family member    Pertinent History Pt. c/o numbness and tingling in LE extremities.  Difficulty sleeping and h/o falls due to foot drop/ weakness/ fatigue. H/o L ankle gout    Patient Stated Goals Improve LE strength/ balance and coordination    Currently in Pain? No/denies          There.ex:   SciFit bike for 12 min with use of UE's/LE's. Performed FOTO with spouse while on bike.   FOTO score: 53  Reassessed LLE strength: L DF and PF 5/5 MMT. R DF and PF 5/5  MMT  Reassessed pt's ability to asc/desc stairs: Able to safely asc/desc stairs with recip pattern with use of single handrail and walking stick with no LOB or scuffing of feet.  Educated and discussed progressive HEP with pt and spouse with demonstrations on form/technique.  STS  Squats  B lunges   Resisted DF with red TB (given green and blue TB as well)  Standing and seated gastroc stretch   Standing/seated hamstring stretch   PT Education - 03/24/20 1526    Education Details Progressive HEP given for improving ankle mobility and BLE strength    Person(s) Educated Patient;Spouse    Methods Explanation;Demonstration;Verbal cues    Comprehension Verbalized understanding               PT Long Term Goals - 03/24/20 1527      PT LONG TERM GOAL #1   Title Pt. will increase FOTO to 50 to improve pain-free functional mobility.    Baseline Initial: 43; 02/25/2020: 43; 8/5: 53    Time 0    Period Weeks    Status Achieved    Target Date 03/24/20      PT LONG TERM GOAL #2   Title Pt. able to  ambulate community distances with least assistive device and no AFO with more normalized gait pattern to improve independence.    Baseline L antalgic gait pattern with use of walking stick.  Pt. has heel strike on L (no AFO needed at this time).  7/6:  pt. limited by decrease endurance.  Pt. requires use of walking stick and extra time to safely ambulate.  Pt. showing progress towards goal.; 8/5: Walks with normalized gait with walking stick with decreased gait speed.    Time 0    Period Weeks    Status Achieved    Target Date 03/24/20      PT LONG TERM GOAL #3   Title Pt. independent with HEP to increase L ankle DF/PF to 5/5 MMT to improve standing tolerance/ gait pattern.    Baseline Ankle strength grossly 4+/5 MMT (joint stiffness in L ankle); 02/25/2020: 4+/5 DF, 5/5 PF on L ankle; 8/5: 5/5 for B ankle DF/PF    Time 0    Period Weeks    Status Achieved    Target Date 03/24/20      PT  LONG TERM GOAL #4   Title Pt. will complete Berg balance test and score >46/56 to improve safety/ decrease fall risk with appropriate assistive device.    Baseline Initial: 27/56; 02/25/2020: 48/56    Time 4    Period Weeks    Status Achieved      PT LONG TERM GOAL #5   Title Pt. able to ascend/ descend with recip. pattern and single handrail safely with no LOB to enter house safely.    Baseline Pt. currently UE assist on handrail and occasional wife assist.  Pt. fearful of falling on stairs.02/25/2020: Asc/desc with single handrail. occasional scuff of toe/heel.; 8/5: able to asc/desc full staircase with recip pattern and single handrail with no LOB.    Time 0    Period Weeks    Status Achieved    Target Date 03/24/20                 Plan - 03/24/20 1531    Clinical Impression Statement Pt has accomplishde all goals written for PT. Pt improved FOTO score to 53 with a goal of 50, displaying improvements in functional mobility. Pt also safely able to asc/desc full staircase with recip pattern with single handrail use and use of walking stick with no LOB or scuffing of feet. Pt improved LLE DF strength to 5/5,which demonsrates pt has full strength to DF LLE during swing phase of gait. Pt given progressive HEP for pt to improve BLE strength and ankle mobility with education on form/technique discussed with spouse and pt. No questions or concerns from either pt or spouse about HEP.    Personal Factors and Comorbidities Comorbidity 3+    Stability/Clinical Decision Making Evolving/Moderate complexity    Clinical Decision Making Moderate    Rehab Potential Fair    PT Frequency One time visit    PT Duration --    PT Treatment/Interventions ADLs/Self Care Home Management;Aquatic Therapy;Cryotherapy;Electrical Stimulation;Moist Heat;Gait training;Stair training;Functional mobility training;Therapeutic activities;Therapeutic exercise;Balance training;Neuromuscular re-education;Manual  techniques;Patient/family education;Passive range of motion;Dry needling    PT Home Exercise Plan [facility]    Consulted and Agree with Plan of Care Patient;Family member/caregiver    Family Member Consulted Spouse           Patient will benefit from skilled therapeutic intervention in order to improve the following deficits and impairments:  Abnormal gait, Decreased coordination, Decreased range of motion,  Difficulty walking, Decreased endurance, Decreased activity tolerance, Pain, Improper body mechanics, Impaired flexibility, Hypomobility, Postural dysfunction, Decreased strength  Visit Diagnosis: Foot drop, left  Joint stiffness  Imbalance  Muscle weakness (generalized)  Gait difficulty     Problem List Patient Active Problem List   Diagnosis Date Noted  . Benign prostatic hyperplasia 11/01/2019  . Elevated PSA 11/01/2019  . Gross hematuria 11/01/2019   Cammie Mcgee, PT, DPT # [id number] Ronnie [place], SPT 03/25/2020, 1:29 PM  [facility] [address] [place], [place], [address] Phone: [phone]   Fax:  [phone]  Name: Diar Berkel MRN: [id number] Date of Birth: 1951/08/17

## 2020-03-25 ENCOUNTER — Ambulatory Visit: Payer: No Typology Code available for payment source | Admitting: Podiatry

## 2020-04-05 ENCOUNTER — Ambulatory Visit: Payer: No Typology Code available for payment source | Admitting: Podiatry

## 2020-04-12 ENCOUNTER — Ambulatory Visit (INDEPENDENT_AMBULATORY_CARE_PROVIDER_SITE_OTHER): Payer: Medicare Other

## 2020-04-12 ENCOUNTER — Other Ambulatory Visit: Payer: Self-pay

## 2020-04-12 ENCOUNTER — Ambulatory Visit (INDEPENDENT_AMBULATORY_CARE_PROVIDER_SITE_OTHER): Payer: Medicare Other | Admitting: Podiatry

## 2020-04-12 DIAGNOSIS — M71472 Calcium deposit in bursa, left ankle and foot: Secondary | ICD-10-CM | POA: Diagnosis not present

## 2020-04-12 DIAGNOSIS — M898X7 Other specified disorders of bone, ankle and foot: Secondary | ICD-10-CM

## 2020-04-12 NOTE — Progress Notes (Signed)
HPI: 68 y.o. male recently diagnosed with DM type II presenting today as a new patient with his wife for evaluation of left foot pain. Patient has a longstanding/complicated history associated with neurological problems affecting his lower extremities.  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  Patient's main complaint today is in regards to a large lump that developed approximately 4 years ago to the lateral aspects of the patient's left midfoot patient states that it is very bothersome and irritating with shoes.  He does recollect a history of multiple injuries to the lower extremities due to his active [employer].  Currently he has not done anything for treatment to the large lump to the lateral aspect of the foot because he has been focused on other health issues.  He presents today for further treatment and evaluation  Past Medical History:  Diagnosis Date  . Cataract   . CIDP (chronic inflammatory demyelinating polyneuropathy) (HCC)   . Gout   . Guillain Barr syndrome (HCC)   . Heart attack (HCC)   . Heart disease   . Kidney stone   . Prostate enlargement   . Reflux gastritis      Physical Exam: General: The patient is alert and oriented x3 in no acute distress.  Dermatology: Skin is warm, dry and supple bilateral lower extremities. Negative for open lesions or macerations.  Vascular: Palpable pedal pulses bilaterally. No edema or erythema noted. Capillary refill within normal limits.  Neurological: Epicritic and protective threshold grossly intact bilaterally.   Musculoskeletal Exam: Range of motion within normal limits to all pedal and ankle joints bilateral. Muscle strength 5/5 in all groups bilateral.  Large palpable, well adhered, hard, nodule noted overlying the fifth metatarsal tubercle  of the left foot.  There is some associated tenderness to palpation  Radiographic Exam:  Normal osseous mineralization. Joint spaces preserved. No fracture/dislocation/boney destruction.  There is a small fleck of extra-articular bone on the fifth metatarsal tubercle left foot possibly indicated of a old injury  Assessment: 1.  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  Demyelinating peripheral polyneuropathy 4.  Diabetes mellitus type 2   Plan of Care:  1. Patient evaluated. X-Rays reviewed.  2.  Since the patient is recently diabetic with peripheral polyneuropathy we are going to set up an appointment with Pedorthist for custom molded insoles and diabetic shoes 3.  Today we discussed conservative versus surgical management of the large exostosis and large nodule to the lateral aspect of the left foot.  I explained that it is not urgent that it be removed.  Only recommend surgical intervention and removal of the large nodule if it is symptomatic.  The patient states that is very symptomatic however currently he is not in a health position to have surgery. 4.  Return to clinic once the patient feels healthy and is ready to have the large nodule removed.  We discussed the extensive postoperative recovery the patient is fully aware      Felecia Shelling, DPM [facility]  Dr. Felecia Shelling, DPM    [address]  [place], [place] [address]                Office [phone]  Fax [phone]

## 2020-04-12 NOTE — Progress Notes (Signed)
Dg  

## 2020-05-18 ENCOUNTER — Ambulatory Visit: Payer: Medicare Other | Admitting: Orthotics

## 2020-05-18 ENCOUNTER — Other Ambulatory Visit: Payer: Self-pay

## 2020-05-18 DIAGNOSIS — M21372 Foot drop, left foot: Secondary | ICD-10-CM

## 2020-05-18 DIAGNOSIS — R296 Repeated falls: Secondary | ICD-10-CM

## 2020-05-18 DIAGNOSIS — R29898 Other symptoms and signs involving the musculoskeletal system: Secondary | ICD-10-CM

## 2020-05-18 DIAGNOSIS — M898X7 Other specified disorders of bone, ankle and foot: Secondary | ICD-10-CM

## 2020-05-18 NOTE — Progress Notes (Signed)
Being seen by [facility] for primary care/cannot get DBS from u s.

## 2020-09-08 ENCOUNTER — Other Ambulatory Visit: Payer: Self-pay

## 2020-09-08 ENCOUNTER — Encounter: Payer: Self-pay | Admitting: Urology

## 2020-09-08 ENCOUNTER — Ambulatory Visit (INDEPENDENT_AMBULATORY_CARE_PROVIDER_SITE_OTHER): Payer: Medicare Other | Admitting: Urology

## 2020-09-08 VITALS — BP 121/70 | HR 75 | Ht 71.0 in | Wt 195.0 lb

## 2020-09-08 DIAGNOSIS — R310 Gross hematuria: Secondary | ICD-10-CM

## 2020-09-08 DIAGNOSIS — N138 Other obstructive and reflux uropathy: Secondary | ICD-10-CM | POA: Diagnosis not present

## 2020-09-08 DIAGNOSIS — N401 Enlarged prostate with lower urinary tract symptoms: Secondary | ICD-10-CM | POA: Diagnosis not present

## 2020-09-08 LAB — URINALYSIS, COMPLETE
Bilirubin, UA: NEGATIVE
Glucose, UA: NEGATIVE
Ketones, UA: NEGATIVE
Nitrite, UA: NEGATIVE
Specific Gravity, UA: 1.015 (ref 1.005–1.030)
Urobilinogen, Ur: 2 mg/dL — ABNORMAL HIGH (ref 0.2–1.0)
pH, UA: 5.5 (ref 5.0–7.5)

## 2020-09-08 LAB — MICROSCOPIC EXAMINATION
RBC, Urine: >30 /hpf — AB (ref 0–2)
WBC, UA: >30 /hpf — AB (ref 0–5)

## 2020-09-08 NOTE — Progress Notes (Signed)
09/08/2020 9:59 AM   Andres Young 1951/04/13 [id number]  Referring provider: Su Monks, PA [address] [place],  [place] [address]  Chief Complaint  Patient presents with  . Hematuria    HPI: 70 y.o. male presents for follow-up.   Initially seen 10/30/2019  Previous urology records were received and reviewed  Prostate biopsy 01/03/2015 for PSA 6.51; prostate volume 65 cc; benign pathology  Last visit at prior practice was 02/04/2019; prostate described as "very large" and was going to be scheduled for laser vaporization however canceled due to COVID  Cystoscopy 07/2018 described as "very large prostate" with significant median lobe and friability  CT urogram 07/2018: 4 mm nonobstructing left lower pole calculus; marked prostate enlargement measuring 7 cm in transverse diameter with intravesical median lobe; volume not calculated  Continues with severe LUTS (prior visit IPSS 27/35) and intermittent hematuria  Desires to proceed with outlet surgery; off dutasteride for ~1 year   PMH: Past Medical History:  Diagnosis Date  . Cataract   . CIDP (chronic inflammatory demyelinating polyneuropathy) (HCC)   . Gout   . Guillain Barr syndrome (HCC)   . Heart attack (HCC)   . Heart disease   . Kidney stone   . Prostate enlargement   . Reflux gastritis     Surgical History: Past Surgical History:  Procedure Laterality Date  . CORONARY ANGIOPLASTY WITH STENT PLACEMENT    . EYE SURGERY      Home Medications:  Allergies as of 09/08/2020   No Known Allergies     Medication List       Accurate as of September 08, 2020  9:59 AM. If you have any questions, ask your nurse or doctor.        STOP taking these medications   clonazePAM 0.5 MG tablet Commonly known as: KLONOPIN Stopped by: Riki Altes, MD   diltiazem 240 MG 24 hr capsule Commonly known as: TIAZAC Stopped by: Riki Altes, MD   doxycycline 100 MG capsule Commonly known as:  VIBRAMYCIN Stopped by: Riki Altes, MD   dutasteride 0.5 MG capsule Commonly known as: AVODART Stopped by: Riki Altes, MD   nortriptyline 10 MG capsule Commonly known as: PAMELOR Stopped by: Riki Altes, MD   PARoxetine 10 MG tablet Commonly known as: PAXIL Stopped by: Riki Altes, MD   predniSONE 10 MG tablet Commonly known as: DELTASONE Stopped by: Riki Altes, MD     TAKE these medications   allopurinol 100 MG tablet Commonly known as: ZYLOPRIM Take 100 mg by mouth 2 (two) times daily.   aspirin 81 MG chewable tablet Chew by mouth daily.   atorvastatin 40 MG tablet Commonly known as: LIPITOR Take 40 mg by mouth daily.   calcium-vitamin D 500-200 MG-UNIT tablet Commonly known as: OSCAL WITH D Take 1 tablet by mouth.   carboxymethylcellulose 0.5 % Soln Commonly known as: REFRESH PLUS INSTILL 1 DROP IN EACH EYE FOUR TIMES A DAY   clopidogrel 75 MG tablet Commonly known as: PLAVIX Take 75 mg by mouth daily.   colchicine 0.6 MG tablet Take 0.6 mg by mouth daily.   diltiazem 120 MG tablet Commonly known as: CARDIZEM Take 120 mg by mouth daily.   DULoxetine 30 MG capsule Commonly known as: CYMBALTA Take 30 mg by mouth 3 (three) times daily.   gabapentin 300 MG capsule Commonly known as: NEURONTIN Take 300 mg by mouth 3 (three) times daily.   hydrOXYzine 25 MG  tablet Commonly known as: ATARAX/VISTARIL Take by mouth.   lansoprazole 30 MG capsule Commonly known as: PREVACID Take 30 mg by mouth daily at 12 noon.   metFORMIN 1000 MG tablet Commonly known as: GLUCOPHAGE TAKE ONE TABLET BY MOUTH TWO TIMES A DAY FOR DIABETES   nitroGLYCERIN 0.4 MG SL tablet Commonly known as: NITROSTAT Place under the tongue.   SUMAtriptan 100 MG tablet Commonly known as: IMITREX TAKE ONE TABLET BY MOUTH AS DIRECTED AT MIGRAINE ONSET. MAY REPEAT DOSE IN 2 HOURS IF NEEDED. DO NOT TAKE MORE THAN 200 MG IN 24 HOURS (8 DOSES OF 25 MG TABLET, 4 DOSES OF  50 MG TABLET, 2 DOSES OF 100 MG TABLET).   topiramate 50 MG tablet Commonly known as: TOPAMAX Take 1 tablet by mouth daily.   vitamin B-12 100 MCG tablet Commonly known as: CYANOCOBALAMIN Take 100 mcg by mouth daily.       Allergies: No Known Allergies  Family History: Family History  Problem Relation Age of Onset  . Heart disease Mother   . Kidney disease Father     Social History:  reports that he has quit smoking. He has never used smokeless tobacco. He reports previous alcohol use. He reports previous drug use.   Physical Exam: BP 121/70   Pulse 75   Ht 5\' 11"  (1.803 m)   Wt 195 lb (88.5 kg)   BMI 27.20 kg/m   Constitutional:  Alert and oriented, No acute distress. HEENT: [place] AT, moist mucus membranes.  Trachea midline, no masses. Cardiovascular: No clubbing, cyanosis, or edema. Respiratory: Normal respiratory effort, no increased work of breathing. Skin: No rashes, bruises or suspicious lesions. Neurologic: Grossly intact, no focal deficits, moving all 4 extremities. Psychiatric: Normal mood and affect.   Assessment & Plan:    1. BPH with severe LUTS  Based on prior cystoscopy and ultrasound reports his prostate volume may be significant.  The only calculated volume was in 2016  Recommend scheduling repeat cystoscopy and TRUS prostate for volume to better determine his best outlet procedure   2. Gross hematuria  Most likely secondary to BPH  Cystoscopy to make sure no changes since last cystoscopy 2019   2020, MD  [facility] [address] [place], [place] [phone]

## 2020-09-09 ENCOUNTER — Ambulatory Visit: Payer: Medicare Other | Admitting: Urology

## 2020-09-09 ENCOUNTER — Encounter: Payer: Self-pay | Admitting: Urology

## 2020-09-13 LAB — CULTURE, URINE COMPREHENSIVE

## 2020-09-14 ENCOUNTER — Telehealth: Payer: Self-pay | Admitting: *Deleted

## 2020-09-14 MED ORDER — CEPHALEXIN 500 MG PO CAPS: 500.0000 mg | ORAL_CAPSULE | Freq: Two times a day (BID) | ORAL | 0 refills | Status: AC

## 2020-09-14 NOTE — Telephone Encounter (Signed)
-----   Message from Harle Battiest, PA-C sent at 09/14/2020 10:50 AM EST ----- Mr. Dinapoli urine culture is positive for infection and needs to start Keflex 500 mg, BID x 7 days.

## 2020-10-06 ENCOUNTER — Encounter: Payer: Self-pay | Admitting: Urology

## 2020-10-06 ENCOUNTER — Ambulatory Visit (INDEPENDENT_AMBULATORY_CARE_PROVIDER_SITE_OTHER): Payer: Medicare Other | Admitting: Urology

## 2020-10-06 ENCOUNTER — Other Ambulatory Visit: Payer: Self-pay

## 2020-10-06 VITALS — BP 165/70 | HR 79 | Ht 71.0 in | Wt 197.0 lb

## 2020-10-06 DIAGNOSIS — R310 Gross hematuria: Secondary | ICD-10-CM | POA: Diagnosis not present

## 2020-10-06 LAB — URINALYSIS, COMPLETE
Bilirubin, UA: NEGATIVE
Glucose, UA: NEGATIVE
Nitrite, UA: NEGATIVE
Specific Gravity, UA: 1.02 (ref 1.005–1.030)
Urobilinogen, Ur: >8 mg/dL — ABNORMAL HIGH (ref 0.2–1.0)
pH, UA: 7.5 (ref 5.0–7.5)

## 2020-10-06 LAB — MICROSCOPIC EXAMINATION: RBC, Urine: >30 /hpf — AB (ref 0–2)

## 2020-10-06 NOTE — Progress Notes (Signed)
10/06/20  Chief Complaint  Patient presents with  . Cysto     HPI:  Prostate biopsy 01/03/2015 for PSA 6.51; prostate volume 65 cc; benign pathology  Last visit at prior practice was 02/04/2019; prostate described as "very large" and was going to be scheduled for laser vaporization however canceled due to COVID  Cystoscopy 07/2018 described as "very large prostate" with significant median lobe and friability  CT urogram 07/2018: 4 mm nonobstructing left lower pole calculus; marked prostate enlargement measuring 7 cm in transverse diameter with intravesical median lobe; volume not calculated  Continues with severe LUTS (prior visit IPSS 27/35) and intermittent hematuria  Desires to proceed with outlet surgery; off dutasteride for ~43 year  70 year old male with refractory urinary symptoms related to BPH who presents today to the office for cystoscopy and prostate sizing   Please see previous notes for details.     Blood pressure (!) 165/70, pulse 79, height 5\' 11"  (1.803 m), weight 197 lb (89.4 kg). NED. A&Ox3.   No respiratory distress   Abd soft, NT, ND Normal phallus with bilateral descended testicles    Cystoscopy Procedure Note  Patient identification was confirmed, informed consent was obtained, and patient was prepped using Betadine solution.  Lidocaine jelly was administered per urethral meatus.    Pre-Procedure: - Inspection reveals a normal caliber urethral meatus.  Procedure: The flexible cystoscope was introduced without difficulty - No urethral strictures/lesions are present. - Coapting lateral lobes with long prostatic urethra  - Mild elevation bladder neck - Bilateral ureteral orifices identified - Bladder mucosa  reveals no ulcers, tumors, or lesions - No bladder stones -Moderate trabeculation  Retroflexion shows lateral lobe intravesical protrusion   Post-Procedure: - Patient tolerated the procedure well  Assessment/ Plan:  BPH with  LUTS   Prostate transrectal ultrasound sizing   Informed consent was obtained after discussing risks/benefits of the procedure.  A time out was performed to ensure correct patient identity.   Pre-Procedure: -Transrectal probe was placed without difficulty -Transrectal Ultrasound performed revealing a  110 gm prostate measuring 6.9 x 5.1 x 5.9 cm (length) -No significant hypoechoic or median lobe noted      Assessment/ Plan:  BPH with LUTS  Outlet procedures were again reviewed.  Potential minimally invasive procedures that he would be a candidate for our Rezum and aqua ablation  We previously discussed HoLEP which he is very interested in pursuing  Will tentatively schedule and he will have a preop appointment with Dr. or Dr. Apolinar Junes, MD

## 2020-10-10 ENCOUNTER — Telehealth: Payer: Self-pay | Admitting: Urology

## 2020-10-10 NOTE — Telephone Encounter (Signed)
I called this pt to schedule  a preop appointment with Dr. Richardo Hanks to discuss HoLEP (per Dr.Stoioff). Pt.'s wife Okey Regal) informed me that she had taken pt to the [place] on Sunday 10/09/20 for a UTI and was given an abx. She wanted his preop appointment scheduled for mid March due to being out of town the week of 10/25/20. She also wanted it noted the [place] did an U/S which showed large Kidney stone and Gallstones.

## 2020-11-02 ENCOUNTER — Ambulatory Visit: Payer: Medicare Other | Admitting: Urology

## 2020-11-28 ENCOUNTER — Other Ambulatory Visit: Payer: Self-pay

## 2020-11-28 DIAGNOSIS — N401 Enlarged prostate with lower urinary tract symptoms: Secondary | ICD-10-CM

## 2020-11-28 DIAGNOSIS — N138 Other obstructive and reflux uropathy: Secondary | ICD-10-CM

## 2020-11-29 ENCOUNTER — Other Ambulatory Visit
Admission: RE | Admit: 2020-11-29 | Discharge: 2020-11-29 | Disposition: A | Discharge status: Home / Self Care | Payer: Medicare Other | Source: Ambulatory Visit | Attending: Urology | Admitting: Urology

## 2020-11-29 ENCOUNTER — Other Ambulatory Visit: Payer: Self-pay

## 2020-11-29 ENCOUNTER — Encounter: Payer: Self-pay | Admitting: Urology

## 2020-11-29 ENCOUNTER — Ambulatory Visit (INDEPENDENT_AMBULATORY_CARE_PROVIDER_SITE_OTHER): Payer: Medicare Other | Admitting: Urology

## 2020-11-29 VITALS — BP 129/75 | HR 77 | Ht 71.0 in | Wt 189.0 lb

## 2020-11-29 DIAGNOSIS — N401 Enlarged prostate with lower urinary tract symptoms: Secondary | ICD-10-CM

## 2020-11-29 DIAGNOSIS — N138 Other obstructive and reflux uropathy: Secondary | ICD-10-CM

## 2020-11-29 DIAGNOSIS — N200 Calculus of kidney: Secondary | ICD-10-CM

## 2020-11-29 DIAGNOSIS — N390 Urinary tract infection, site not specified: Secondary | ICD-10-CM

## 2020-11-29 DIAGNOSIS — R310 Gross hematuria: Secondary | ICD-10-CM

## 2020-11-29 LAB — URINALYSIS, COMPLETE (UACMP) WITH MICROSCOPIC
Bilirubin Urine: NEGATIVE
Glucose, UA: NEGATIVE mg/dL
Ketones, ur: NEGATIVE mg/dL
Nitrite: NEGATIVE
Protein, ur: NEGATIVE mg/dL
Specific Gravity, Urine: 1.014 (ref 1.005–1.030)
Squamous Epithelial / HPF: NONE SEEN (ref 0–5)
pH: 5 (ref 5.0–8.0)

## 2020-11-29 NOTE — Progress Notes (Signed)
   11/29/2020 2:42 PM   Andres Young [birth year] [id number]  Reason for visit: BPH, discuss HOLEP, recurrent UTIs  HPI: I saw Andres Young in urology clinic for the above issues.  He is a 70 year old male with a history of kidney stones and gross hematuria as well as recurrent UTIs who was undergoing work-up for an outlet procedure by Dr. Lonna Cobb.  He was found to have a 110 g prostate on TRUS, and was referred to me for consideration of HOLEP.  Since that time, he has had a very complicated course.  Most of the history is obtained from his wife today.  It sounds like he developed urosepsis from an obstructing stone and underwent emergent stent placement at the [facility] with a prolonged hospitalization.  Per his wife, they also diagnosed him with prostatitis and a possible prostate abscess at that time.  He then underwent a follow-up ureteroscopy and stone removal with stent exchange, and that stent has since been removed.  He remains on Cipro.  He reports he is feeling well over the last few days.  Only [facility] are available to me, and I reviewed these extensively.  None of those images are available to personally review, and I requested that they bring in a disc with those images.  He would like to hold off on any further procedures at this time now he is finally feeling better.  I think this is very reasonable.  We discussed return precautions extensively to consider HOLEP more quickly including retention, recurrent UTIs, gross hematuria.  We discussed the risks and benefits of HoLEP at length.  The procedure requires general anesthesia and takes 2 to 3 hours, and a holmium laser is used to enucleate the prostate and push this tissue into the bladder.  A morcellator is then used to remove this tissue, which is sent for pathology.  The vast majority of patients are able to discharge the same day with a catheter in place for 2 to 3 days, and will follow-up in clinic for a voiding trial.   Approximately 5% of patients will be admitted overnight to monitor the urine, or if they have multiple co-morbidities.  We specifically discussed the risks of bleeding, infection, retrograde ejaculation, temporary urgency and urge incontinence, very low risk of long-term incontinence, pathologic evaluation of prostate tissue and possible detection of prostate cancer or other malignancy, and possible need for additional procedures.  Finally, urinalysis today is equivocal with 20-50 WBCs, no squamous cells, 20-50 RBCs, rare bacteria, large leukocytes, nitrite negative.  Will send for culture and call with those results.  -They would like to follow-up in 3 months with a PVR and symptom check, and we again reviewed return precautions extensively -We will call with urine culture results(currently on Cipro prescribed by [facility])  I spent 45 total minutes on the day of the encounter including pre-visit review of the medical record, face-to-face time with the patient, and post visit ordering of labs/imaging/tests.  Sondra Come, MD  [facility] [address], [address] [place], [place] [address] [phone]

## 2020-11-29 NOTE — Patient Instructions (Signed)

## 2020-12-05 ENCOUNTER — Telehealth: Payer: Self-pay

## 2020-12-05 DIAGNOSIS — N390 Urinary tract infection, site not specified: Secondary | ICD-10-CM

## 2020-12-05 NOTE — Telephone Encounter (Signed)
Pt wife called looking for urine culture results. It does not look like urine culture was ever release

## 2020-12-05 NOTE — Telephone Encounter (Signed)
Dr. Richardo Hanks, I spoke with the lab [facility] )  and they missed this culture, should I call the pt in to have it redone?

## 2020-12-06 NOTE — Telephone Encounter (Signed)
Please let them know the lab did not send the culture even though we ordered it.  Only repeat urinalysis and culture if he is having any UTI symptoms, malaise, or fevers, thanks  Legrand Rams, MD 12/06/2020

## 2020-12-07 ENCOUNTER — Other Ambulatory Visit: Payer: Medicare Other

## 2020-12-07 ENCOUNTER — Other Ambulatory Visit: Payer: Self-pay

## 2020-12-07 DIAGNOSIS — N390 Urinary tract infection, site not specified: Secondary | ICD-10-CM

## 2020-12-07 NOTE — Telephone Encounter (Signed)
Spoke with patient's wife and she states he is having fatigue, malaise, nausea, and lack of appetite with weight loss. She states his current weight 174lb, last office weight 189lb on 11/29/20. He is currently on Cipro 500mg  BID complete on 12/09/20. She states he does not have fever, chills, or urinary symptoms. He will come in today and drop off another sample for culture.

## 2020-12-09 ENCOUNTER — Telehealth: Payer: Self-pay

## 2020-12-09 NOTE — Telephone Encounter (Signed)
Incoming call from pt's wife who requests urine culture results. Advise wife that report is currently only preliminary, however no growth as of yet. Wife voiced understanding.

## 2020-12-10 LAB — CULTURE, URINE COMPREHENSIVE

## 2020-12-12 ENCOUNTER — Telehealth: Payer: Self-pay

## 2020-12-12 NOTE — Telephone Encounter (Signed)
-----   Message from Sondra Come, MD sent at 12/12/2020 10:49 AM EDT ----- Urine culture negative, keep follow-up as scheduled

## 2021-02-28 ENCOUNTER — Other Ambulatory Visit: Payer: Self-pay

## 2021-02-28 ENCOUNTER — Encounter: Payer: Self-pay | Admitting: Urology

## 2021-02-28 ENCOUNTER — Ambulatory Visit (INDEPENDENT_AMBULATORY_CARE_PROVIDER_SITE_OTHER): Payer: Medicare Other | Admitting: Urology

## 2021-02-28 VITALS — BP 132/72 | HR 77 | Ht 71.0 in | Wt 189.0 lb

## 2021-02-28 DIAGNOSIS — N401 Enlarged prostate with lower urinary tract symptoms: Secondary | ICD-10-CM

## 2021-02-28 DIAGNOSIS — N138 Other obstructive and reflux uropathy: Secondary | ICD-10-CM | POA: Diagnosis not present

## 2021-02-28 DIAGNOSIS — N390 Urinary tract infection, site not specified: Secondary | ICD-10-CM

## 2021-02-28 DIAGNOSIS — N200 Calculus of kidney: Secondary | ICD-10-CM

## 2021-02-28 LAB — BLADDER SCAN AMB NON-IMAGING

## 2021-02-28 NOTE — Progress Notes (Signed)
   02/28/2021 12:56 PM   Lissa Merlin [birth year] [id number]  Reason for visit: Follow up BPH, prostatitis, nephrolithiasis  HPI: I saw Mr. Oommen and his wife back in urology clinic today.  She again provides a significant portion of the history.  Briefly, 71 year old male who was undergoing work-up for an outlet procedure by Dr. Lonna Cobb and was found to have 810 g prostate on TRUS and was referred to me for consideration of HOLEP.  Before I saw him to discuss HOLEP, he had a complicated course with sepsis from urinary source and obstructing stone and had a prolonged hospitalization at the [place] with stent placement and ultimately definitive stone removal, as well as possible prostatitis/prostate abscess.  He has recovered from those surgeries and is doing well.  A follow-up ultrasound at the [place] showed no evidence of hydronephrosis, and I reviewed those records from the [place].  Regarding his urination he feels like things have improved and he continues taking finasteride daily.  IPSS score today is 11, and PVR is normal at 39 mL.  They would like to hold off on any further treatments at this time since he is doing well, and while he continues to recover from his multiple hospitalizations over the last few months.  Continue finasteride RTC 6 months PVR Consider HOLEP again sooner if recurrent UTIs or retention  Sondra Come, MD  [facility] [address], [address] [place], [place] [address] [phone]

## 2021-06-24 IMAGING — CR DG CHEST 2V
2 series · 2 of 2 positions shown · non-contrast
Comparison: None.

CLINICAL DATA: Cough, fever

EXAM:
CHEST - 2 VIEW

[chest pa]
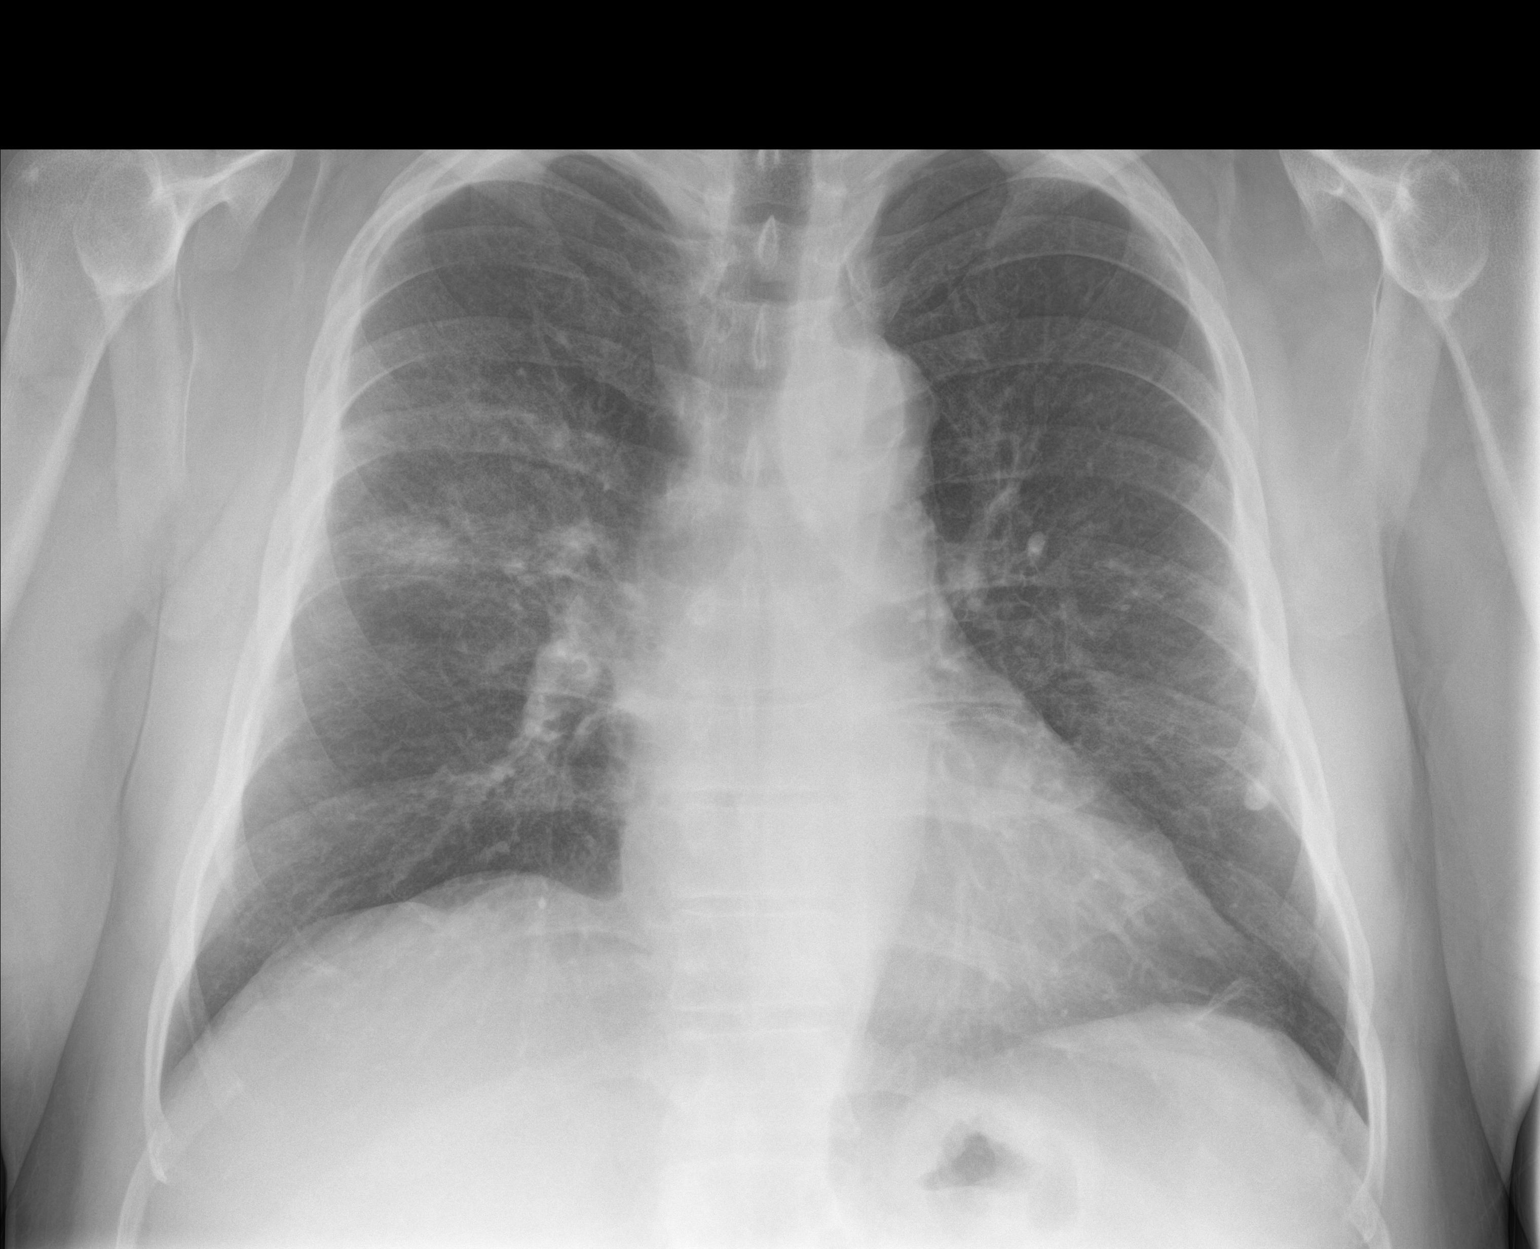

[chest lat]
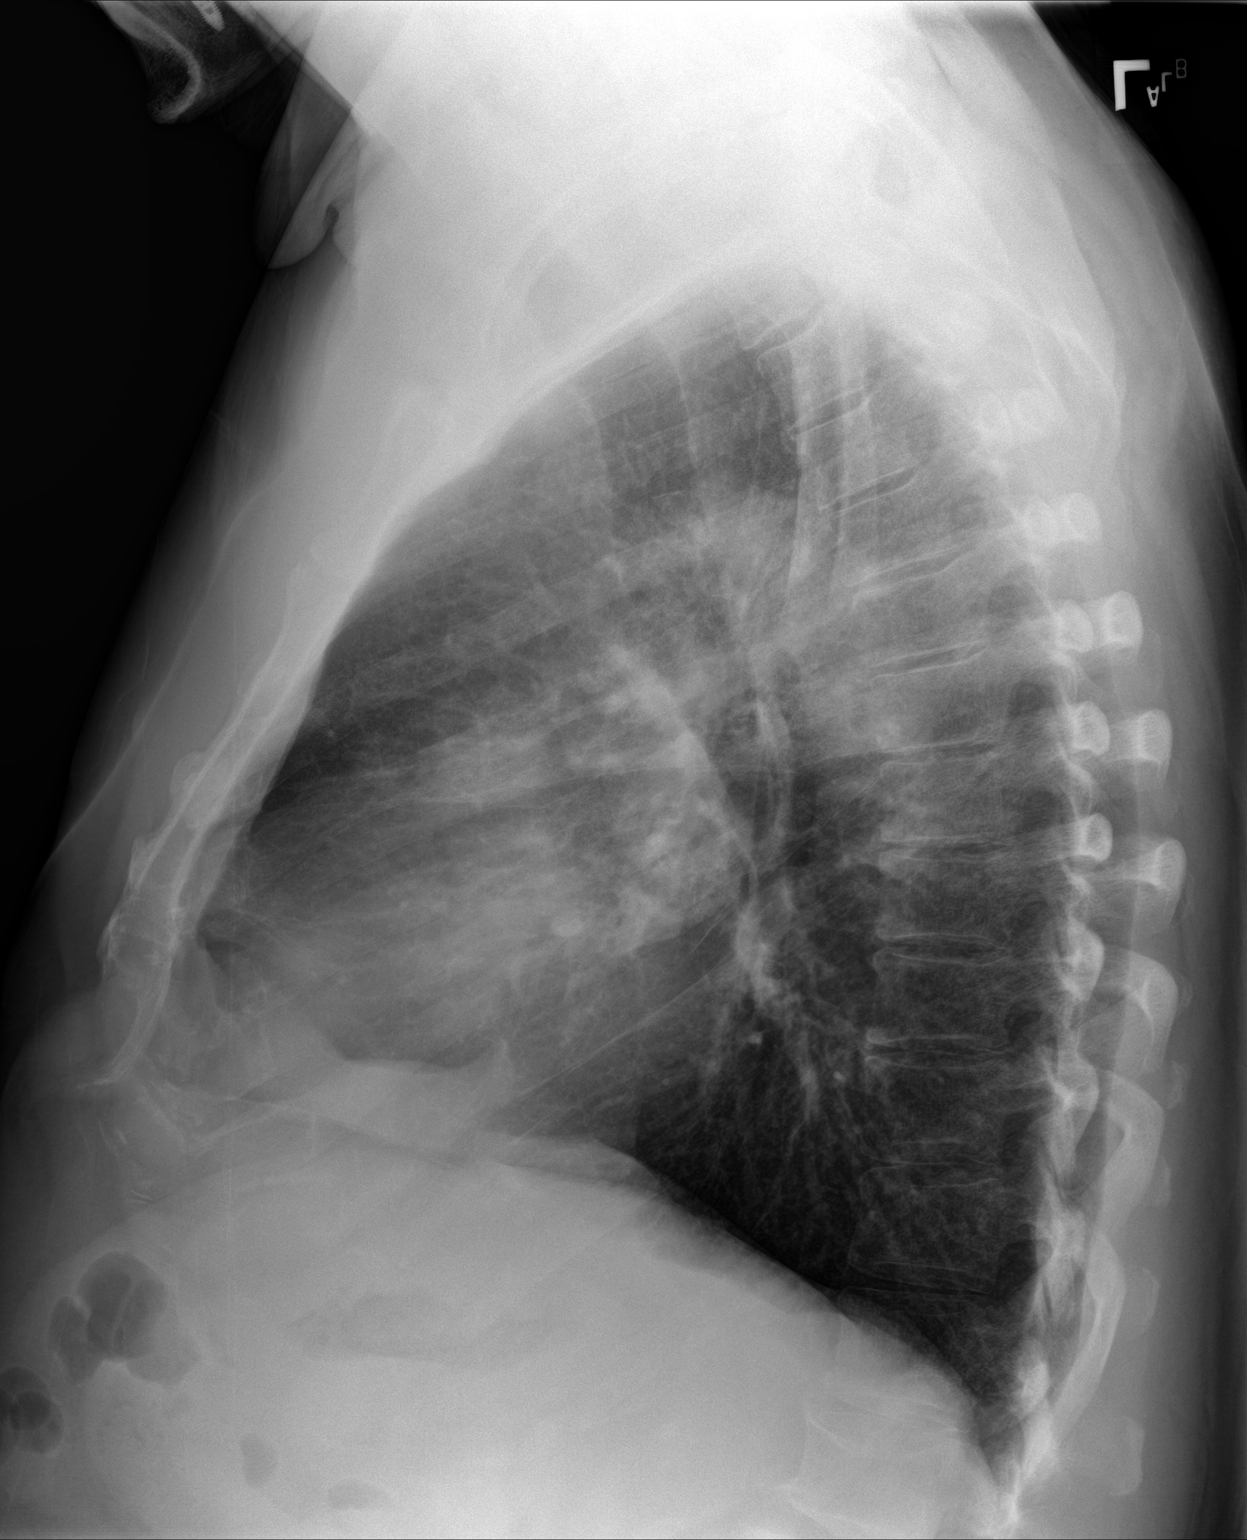

[2 of 2 positions shown; findings below may reference images not displayed]

FINDINGS: Patchy right mid lung density. Probable calcified granuloma of the
left lower lung. No pleural effusion. Cardiomediastinal contours are
within normal limits.
IMPRESSION: Patchy right mid lung density suspicious for pneumonia.

## 2021-08-29 ENCOUNTER — Ambulatory Visit (INDEPENDENT_AMBULATORY_CARE_PROVIDER_SITE_OTHER): Payer: Medicare Other | Admitting: Urology

## 2021-08-29 ENCOUNTER — Ambulatory Visit
Admission: RE | Admit: 2021-08-29 | Discharge: 2021-08-29 | Disposition: A | Discharge status: Home / Self Care | Payer: Medicare Other | Source: Ambulatory Visit | Attending: Urology | Admitting: Urology

## 2021-08-29 ENCOUNTER — Ambulatory Visit
Admission: RE | Admit: 2021-08-29 | Discharge: 2021-08-29 | Disposition: A | Discharge status: Home / Self Care | Payer: Medicare Other | Attending: Urology | Admitting: Urology

## 2021-08-29 ENCOUNTER — Encounter: Payer: Self-pay | Admitting: Urology

## 2021-08-29 ENCOUNTER — Other Ambulatory Visit: Payer: Self-pay

## 2021-08-29 VITALS — BP 118/66 | HR 73 | Ht 71.0 in | Wt 188.0 lb

## 2021-08-29 DIAGNOSIS — N401 Enlarged prostate with lower urinary tract symptoms: Secondary | ICD-10-CM | POA: Diagnosis present

## 2021-08-29 DIAGNOSIS — N138 Other obstructive and reflux uropathy: Secondary | ICD-10-CM | POA: Diagnosis present

## 2021-08-29 DIAGNOSIS — N390 Urinary tract infection, site not specified: Secondary | ICD-10-CM | POA: Diagnosis not present

## 2021-08-29 LAB — BLADDER SCAN AMB NON-IMAGING

## 2021-08-29 MED ORDER — FINASTERIDE 5 MG PO TABS: 5.0000 mg | ORAL_TABLET | Freq: Every day | ORAL | 3 refills | Status: DC

## 2021-08-29 NOTE — Patient Instructions (Signed)

## 2021-08-29 NOTE — Addendum Note (Signed)
Addended by: Frankey Shown on: 08/29/2021 10:18 AM   Modules accepted: Orders

## 2021-08-29 NOTE — Progress Notes (Signed)
° °  08/29/2021 10:05 AM   Deneen Harts 1951/04/10 [id number]  Reason for visit: Follow up BPH, recurrent UTI, nephrolithiasis  HPI: Briefly, 71 year old male who was undergoing work-up for an outlet procedure by Dr. Bernardo Heater and was found to have 110 g prostate on TRUS and was referred to me for consideration of HOLEP.  Before I saw him to discuss HOLEP, he had a complicated course in March 2022 with sepsis from urinary source and obstructing stone and had a prolonged hospitalization at the [place] with stent placement and ultimately definitive stone removal, as well as possible prostatitis/prostate abscess.   He has recovered from those surgeries and is doing well.  A follow-up ultrasound at the [facility] showed no evidence of hydronephrosis. Regarding his urination he feels like things have improved and he continues taking finasteride daily.  PVR is normal again today at 89 mL.  Primary urinary complaint is nocturia 1-2 times overnight, but no significant problems during the day.  He would like to continue medical management alone with finasteride at this time as he continues to recover from his complicated spring [id number] with multiple hospitalizations.  We discussed return precautions including UTIs, recurrent gross hematuria, or worsening urinary symptoms that would warrant consideration of HOLEP sooner.  Continue finasteride RTC 1 year PVR, sooner if UTIs or problems    Billey Co, [place] [address], [place], [place] [address] [phone]

## 2022-09-04 ENCOUNTER — Ambulatory Visit (INDEPENDENT_AMBULATORY_CARE_PROVIDER_SITE_OTHER): Payer: Medicare Other | Admitting: Urology

## 2022-09-04 VITALS — BP 118/79 | HR 92

## 2022-09-04 DIAGNOSIS — Z125 Encounter for screening for malignant neoplasm of prostate: Secondary | ICD-10-CM

## 2022-09-04 DIAGNOSIS — N138 Other obstructive and reflux uropathy: Secondary | ICD-10-CM

## 2022-09-04 DIAGNOSIS — N401 Enlarged prostate with lower urinary tract symptoms: Secondary | ICD-10-CM | POA: Diagnosis not present

## 2022-09-04 MED ORDER — FINASTERIDE 5 MG PO TABS: 5.0000 mg | ORAL_TABLET | Freq: Every day | ORAL | 3 refills | Status: DC

## 2022-09-04 NOTE — Progress Notes (Signed)
   09/04/2022 12:33 PM   Andres Young 1951/07/23 [id number]  Reason for visit: Follow up BPH, recurrent UTI, nephrolithiasis, PSA screening  HPI: Briefly, 72 year old male who was undergoing work-up for an outlet procedure by Dr. Bernardo Heater and was found to have 110 g prostate on TRUS and was referred to me for consideration of HOLEP.  Before I saw him to discuss HOLEP, he had a complicated course in March 2022 with sepsis from urinary source and obstructing stone and had a prolonged hospitalization at the [place] with stent placement and ultimately definitive stone removal, as well as possible prostatitis/prostate abscess.   He has recovered from those surgeries and is doing well.  A follow-up ultrasound at the [facility] showed no evidence of hydronephrosis. Regarding his urination he feels like things have improved and he continues taking finasteride daily.  PVRs have always been <16ml.  Primary urinary complaint is nocturia 1-2 times overnight, but no significant problems during the day.  He would like to continue medical management alone with finasteride at this time.  We discussed return precautions including UTIs, recurrent gross hematuria, or worsening urinary symptoms that would warrant consideration of HOLEP sooner.  PSA was checked by PCP in April 2023 and was 2.09, corrected for finasteride 4.2, within the normal range for his age, PSA density very low with his known 110g prostate.  Would not recommend further screening with his comorbidities, and age per the guideline recommendations.  Continue finasteride, refilled RTC 1 year PVR, sooner if UTIs or problems    Billey Co, [place] [address], [place], [place] [address] [phone]

## 2023-05-29 ENCOUNTER — Other Ambulatory Visit
Admission: RE | Admit: 2023-05-29 | Discharge: 2023-05-29 | Disposition: A | Discharge status: Home / Self Care | Payer: Medicare Other | Source: Ambulatory Visit | Attending: Physician Assistant | Admitting: Physician Assistant

## 2023-05-29 DIAGNOSIS — I25118 Atherosclerotic heart disease of native coronary artery with other forms of angina pectoris: Secondary | ICD-10-CM | POA: Diagnosis present

## 2023-05-29 LAB — TROPONIN I (HIGH SENSITIVITY): Troponin I (High Sensitivity): 7 ng/L (ref <18)

## 2023-07-24 ENCOUNTER — Encounter: Payer: Self-pay | Admitting: Cardiology

## 2023-07-24 ENCOUNTER — Ambulatory Visit
Admission: RE | Admit: 2023-07-24 | Discharge: 2023-07-24 | Disposition: A | Discharge status: Home / Self Care | Payer: Medicare Other | Attending: Cardiology | Admitting: Cardiology

## 2023-07-24 ENCOUNTER — Encounter
Admission: RE | Disposition: A | Discharge status: Home / Self Care | Payer: Self-pay | Source: Home / Self Care | Attending: Cardiology

## 2023-07-24 DIAGNOSIS — Z955 Presence of coronary angioplasty implant and graft: Secondary | ICD-10-CM | POA: Diagnosis not present

## 2023-07-24 DIAGNOSIS — I2510 Atherosclerotic heart disease of native coronary artery without angina pectoris: Secondary | ICD-10-CM | POA: Insufficient documentation

## 2023-07-24 DIAGNOSIS — I472 Ventricular tachycardia, unspecified: Secondary | ICD-10-CM

## 2023-07-24 HISTORY — PX: LEFT HEART CATH AND CORONARY ANGIOGRAPHY: CATH118249

## 2023-07-24 LAB — GLUCOSE, CAPILLARY
Glucose-Capillary: 117 mg/dL — ABNORMAL HIGH (ref 70–99)
Glucose-Capillary: 103 mg/dL — ABNORMAL HIGH (ref 70–99)

## 2023-07-24 SURGERY — LEFT HEART CATH AND CORONARY ANGIOGRAPHY
Anesthesia: Moderate Sedation | Laterality: Left

## 2023-07-24 MED ORDER — SODIUM CHLORIDE 0.9 % WEIGHT BASED INFUSION
3.0000 mL/kg/h | INTRAVENOUS | Status: AC
  Administered 2023-07-24: 3 mL/kg/h via INTRAVENOUS

## 2023-07-24 MED ORDER — HEPARIN SODIUM (PORCINE) 1000 UNIT/ML IJ SOLN
INTRAMUSCULAR | Status: AC
Start: 2023-07-24 — End: ?
  Filled 2023-07-24: qty 10

## 2023-07-24 MED ORDER — SODIUM CHLORIDE 0.9 % IV SOLN: 250.0000 mL | INTRAVENOUS | Status: DC | PRN

## 2023-07-24 MED ORDER — MIDAZOLAM HCL 2 MG/2ML IJ SOLN
INTRAMUSCULAR | Status: AC
  Filled 2023-07-24: qty 2

## 2023-07-24 MED ORDER — VERAPAMIL HCL 2.5 MG/ML IV SOLN
INTRAVENOUS | Status: AC
  Filled 2023-07-24: qty 2

## 2023-07-24 MED ORDER — IOHEXOL 300 MG/ML  SOLN
INTRAMUSCULAR | Status: DC | PRN
  Administered 2023-07-24: 136 mL

## 2023-07-24 MED ORDER — SODIUM CHLORIDE 0.9% FLUSH: 3.0000 mL | Freq: Two times a day (BID) | INTRAVENOUS | Status: DC

## 2023-07-24 MED ORDER — FENTANYL CITRATE (PF) 100 MCG/2ML IJ SOLN
INTRAMUSCULAR | Status: AC
  Filled 2023-07-24: qty 2

## 2023-07-24 MED ORDER — ACETAMINOPHEN 325 MG PO TABS: 650.0000 mg | ORAL_TABLET | ORAL | Status: DC | PRN

## 2023-07-24 MED ORDER — SODIUM CHLORIDE 0.9 % WEIGHT BASED INFUSION: 1.0000 mL/kg/h | INTRAVENOUS | Status: DC

## 2023-07-24 MED ORDER — LIDOCAINE HCL (PF) 1 % IJ SOLN
INTRAMUSCULAR | Status: DC | PRN
  Administered 2023-07-24: 2 mL

## 2023-07-24 MED ORDER — HEPARIN (PORCINE) IN NACL 1000-0.9 UT/500ML-% IV SOLN
INTRAVENOUS | Status: DC | PRN
  Administered 2023-07-24 (×2): 500 mL

## 2023-07-24 MED ORDER — HEPARIN (PORCINE) IN NACL 1000-0.9 UT/500ML-% IV SOLN
INTRAVENOUS | Status: AC
  Filled 2023-07-24: qty 1000

## 2023-07-24 MED ORDER — SODIUM CHLORIDE 0.9 % WEIGHT BASED INFUSION
1.0000 mL/kg/h | INTRAVENOUS | Status: DC
  Administered 2023-07-24: 1 mL/kg/h via INTRAVENOUS

## 2023-07-24 MED ORDER — VERAPAMIL HCL 2.5 MG/ML IV SOLN
INTRAVENOUS | Status: DC | PRN
  Administered 2023-07-24: 2.5 mg via INTRAVENOUS

## 2023-07-24 MED ORDER — ONDANSETRON HCL 4 MG/2ML IJ SOLN: 4.0000 mg | Freq: Four times a day (QID) | INTRAMUSCULAR | Status: DC | PRN

## 2023-07-24 MED ORDER — ASPIRIN 81 MG PO CHEW
CHEWABLE_TABLET | ORAL | Status: AC
  Filled 2023-07-24: qty 1

## 2023-07-24 MED ORDER — ASPIRIN 81 MG PO CHEW
81.0000 mg | CHEWABLE_TABLET | ORAL | Status: AC
  Administered 2023-07-24: 81 mg via ORAL

## 2023-07-24 MED ORDER — SODIUM CHLORIDE 0.9% FLUSH: 3.0000 mL | INTRAVENOUS | Status: DC | PRN

## 2023-07-24 MED ORDER — HYDRALAZINE HCL 20 MG/ML IJ SOLN: 10.0000 mg | INTRAMUSCULAR | Status: DC | PRN

## 2023-07-24 MED ORDER — FENTANYL CITRATE (PF) 100 MCG/2ML IJ SOLN
INTRAMUSCULAR | Status: DC | PRN
  Administered 2023-07-24: 50 ug via INTRAVENOUS

## 2023-07-24 MED ORDER — HEPARIN SODIUM (PORCINE) 1000 UNIT/ML IJ SOLN
INTRAMUSCULAR | Status: DC | PRN
  Administered 2023-07-24: 4500 [IU] via INTRAVENOUS

## 2023-07-24 MED ORDER — LIDOCAINE HCL 1 % IJ SOLN
INTRAMUSCULAR | Status: AC
  Filled 2023-07-24: qty 20

## 2023-07-24 MED ORDER — MIDAZOLAM HCL 2 MG/2ML IJ SOLN
INTRAMUSCULAR | Status: DC | PRN
  Administered 2023-07-24: 1 mg via INTRAVENOUS

## 2023-07-24 SURGICAL SUPPLY — 10 items
CATH 5FR JL3.5 JR4 ANG PIG MP (CATHETERS) IMPLANT
CATH INFINITI 5 FR 3DRC (CATHETERS) IMPLANT
DEVICE RAD TR BAND REGULAR (VASCULAR PRODUCTS) IMPLANT
DRAPE BRACHIAL (DRAPES) IMPLANT
GLIDESHEATH SLEND SS 6F .021 (SHEATH) IMPLANT
GUIDEWIRE INQWIRE 1.5J.035X260 (WIRE) IMPLANT
INQWIRE 1.5J .035X260CM (WIRE) ×1
PACK CARDIAC CATH (CUSTOM PROCEDURE TRAY) ×1 IMPLANT
SET ATX-X65L (MISCELLANEOUS) IMPLANT
WIRE GUIDERIGHT .035X150 (WIRE) IMPLANT

## 2023-07-24 NOTE — Progress Notes (Signed)
Dr. Darrold Junker in at bedside speaking with pt. And his wife re: cath results. Both verbalized understanding of conversation with MD.

## 2023-07-25 ENCOUNTER — Encounter: Payer: Self-pay | Admitting: Cardiology

## 2023-09-02 ENCOUNTER — Other Ambulatory Visit: Payer: Self-pay

## 2023-09-02 DIAGNOSIS — N138 Other obstructive and reflux uropathy: Secondary | ICD-10-CM

## 2023-09-03 ENCOUNTER — Ambulatory Visit (INDEPENDENT_AMBULATORY_CARE_PROVIDER_SITE_OTHER): Payer: Medicare Other | Admitting: Urology

## 2023-09-03 VITALS — BP 127/73 | HR 69 | Wt 206.4 lb

## 2023-09-03 DIAGNOSIS — N390 Urinary tract infection, site not specified: Secondary | ICD-10-CM

## 2023-09-03 DIAGNOSIS — Z125 Encounter for screening for malignant neoplasm of prostate: Secondary | ICD-10-CM | POA: Diagnosis not present

## 2023-09-03 DIAGNOSIS — N401 Enlarged prostate with lower urinary tract symptoms: Secondary | ICD-10-CM

## 2023-09-03 DIAGNOSIS — N138 Other obstructive and reflux uropathy: Secondary | ICD-10-CM

## 2023-09-03 LAB — BLADDER SCAN AMB NON-IMAGING

## 2023-09-03 MED ORDER — FINASTERIDE 5 MG PO TABS: 5.0000 mg | ORAL_TABLET | Freq: Every day | ORAL | 3 refills | Status: DC

## 2023-09-03 MED ORDER — TAMSULOSIN HCL 0.4 MG PO CAPS: 0.4000 mg | ORAL_CAPSULE | Freq: Every day | ORAL | 11 refills | Status: DC

## 2023-09-03 NOTE — Progress Notes (Addendum)
   09/03/2023 10:07 AM   Andres Young 05-02-1951 [id number]  Reason for visit: Follow up BPH, recurrent UTI, nephrolithiasis, PSA screening  HPI: 73 year old male who was undergoing work-up for an outlet procedure by Dr. Twylla and was found to have 110 g prostate on TRUS and was initially referred to me in April 2022 for consideration of HOLEP.  Before I saw him to discuss HOLEP, he had a complicated course in March 2022 with sepsis from urinary source and obstructing stone and had a prolonged hospitalization at the [place] with stent placement and ultimately definitive stone removal, as well as possible prostatitis/prostate abscess.   He has recovered from those surgeries and is doing well.  A follow-up ultrasound at the [facility] showed no evidence of hydronephrosis. Regarding his urination he feels like things have improved and he continues on maximal medical therapy with Flomax  and finasteride .  PVRs have always been <124ml, including normal at 3ml today.  Primary urinary complaint is nocturia 1-2 times overnight, but no significant problems during the day.  He would like to continue maximal medical therapy at this time and not interested in outlet procedures.  We discussed return precautions including UTIs, recurrent gross hematuria, or worsening urinary symptoms that would warrant consideration of HOLEP sooner.  PSA was checked by PCP in April 2023 and was 2.09, corrected for finasteride  4.2, within the normal range for his age, PSA density very low with his known 110g prostate.  Would not recommend further screening with his comorbidities, and age per the guideline recommendations.  Has had extensive cardiac workup recently including a cardiac ablation in December 2020 for as well as a catheterization with likely further intervention upcoming.  He is on Plavix long-term for CAD.  I reviewed the outside cardiology notes.  Continue Flomax  and finasteride , refilled RTC 1 year PVR, consider HOLEP in  the future if worsening urinary symptoms despite maximal medical therapy    [name], MD  [facility] [address], [address] [place], [place] [address] [phone]

## 2024-03-26 ENCOUNTER — Encounter: Payer: Self-pay | Admitting: Urology

## 2024-03-27 ENCOUNTER — Telehealth: Payer: Self-pay

## 2024-03-27 NOTE — Telephone Encounter (Signed)
 Patients wife called and left message that her husband has started having blood in his urine. She states he has an extensive medical history and that this just started. She states he is on blood thinners. I returned call but got VM and left VM to call us  back.

## 2024-03-27 NOTE — Telephone Encounter (Signed)
 Patients wife called in and stated patient is have gross hematuria. Stream is dar/brown red in color for 24hrs now. No pain, burning, fever or any other symptoms. Pt is taking plavix. I suggested that they see their primary care or urgent care to make sure its not an underlying infection due to no available appointments today. I did schedule them a F/U on 8/27 with [facility]. I went over if patient started having full stream of blood, thick clots or retention issues to seek attention immediately in the ER. Pts wife stated understanding and that if they had any more concerns to call back.

## 2024-03-27 NOTE — Telephone Encounter (Signed)
 Provider FYI ONLY Disposition:ED Now Patient Agreed to Disposition       Reason for call: Spouse is calling to report patient has been urinating dark blood colored urine since late last night.  Pt has no other symptoms and is on Plavix.  Pt has history of kidney stones, lithotripsy and urosepsis.  Pt had recent cardiac catheterization July 3.     Actions taken during call: Routed encounter to Provider Triage: Triage completed, care advice given per protocol. Triage:Call back parameters given and caller advised of 24 hour nurse triage. Instructed to seek immediate medical attention if new symptoms develop, current symptoms worsen or if you become increasingly concerned. Patient verbalized understanding.     Avelina Jenkins Cellar, RN [facility]  Patient Engagement Center Documentation    Reason for Disposition . Passing pure blood or large blood clots (i.e., larger than a dime or grape)  Additional Information . Negative: Recent genital injury . Negative: Unable to urinate (or only a few drops) > 4 hours and bladder feels very full (e.g., palpable bladder or strong urge to urinate)  Protocols used: Urine - Blood In-A-OH

## 2024-03-29 NOTE — Progress Notes (Signed)
 03/30/2024 6:29 PM   Andres Young April 21, 1951 [id number]  Referring provider: Johnie Perkins, PA-C [id number] [place],  [place] [address]  Urological history: 1. Nephrolithiasis - URS (2022)   2. Elevated PSA - PSA (2021) 6.51 - prostate bx (2016) negative  3. BPH with LU TS - PSA (11/2023) 1.57 - cysto (09/2020) coapting lateral lobes w/ long prostatic urethra, mild elevation bladder neck, moderate trabeculation and lateral lobe intravesical protrusion - TRUS (09/2020) 110 cc  - continue tamsulosin  0.4 mg daily and finasteride  5 mg daily  No chief complaint on file.  HPI: Andres Young is a 73 y.o. man who presents today for blood in the urine.    Previous records reviewed.   Former smoker.    Hbg A1c (02/2024) 5.3  Serum creatinine (02/2024) 0.9  PSA (11/2023) 1.57  PMH: Past Medical History:  Diagnosis Date   Cataract    CIDP (chronic inflammatory demyelinating polyneuropathy) (HCC)    Gout    Guillain Barr syndrome (HCC)    Heart attack (HCC)    Heart disease    Kidney stone    Prostate enlargement    Reflux gastritis     Surgical History: Past Surgical History:  Procedure Laterality Date   CORONARY ANGIOPLASTY WITH STENT PLACEMENT     EYE SURGERY     LEFT HEART CATH AND CORONARY ANGIOGRAPHY Left 07/24/2023   Procedure: LEFT HEART CATH AND CORONARY ANGIOGRAPHY;  Surgeon: Ammon Blunt, MD;  Location: [facility] INVASIVE CV LAB;  Service: Cardiovascular;  Laterality: Left;    Home Medications:  Allergies as of 03/30/2024       Reactions   Metoprolol    Can not take in high doses         Medication List        Accurate as of March 29, 2024  6:29 PM. If you have any questions, ask your nurse or doctor.          allopurinol 100 MG tablet Commonly known as: ZYLOPRIM Take 100 mg by mouth 2 (two) times daily.   aspirin  81 MG chewable tablet Chew 81 mg by mouth daily.   atorvastatin 80 MG tablet Commonly known as:  LIPITOR Take 80 mg by mouth every evening.   calcium-vitamin D 500-200 MG-UNIT tablet Commonly known as: OSCAL WITH D Take 1 tablet by mouth daily.   carboxymethylcellulose 0.5 % Soln Commonly known as: REFRESH PLUS Place 1 drop into both eyes 3 (three) times daily as needed (dry eyes).   clopidogrel 75 MG tablet Commonly known as: PLAVIX Take 75 mg by mouth daily.   DULoxetine 30 MG capsule Commonly known as: CYMBALTA Take 30 mg by mouth 3 (three) times daily.   Emgality 120 MG/ML Soaj Generic drug: Galcanezumab-gnlm Inject 120 mg into the skin every 30 (thirty) days.   ferrous sulfate 325 (65 FE) MG tablet Take 325 mg by mouth daily with breakfast.   finasteride  5 MG tablet Commonly known as: PROSCAR  Take 1 tablet (5 mg total) by mouth daily.   gabapentin 300 MG capsule Commonly known as: NEURONTIN Take 300-600 mg by mouth See admin instructions. 300 mg three times daily, 600 mg at bedtime   lansoprazole 30 MG capsule Commonly known as: PREVACID Take 30 mg by mouth daily.   MAG GLYCINATE PO Take 400 mg by mouth every evening.   metFORMIN 1000 MG tablet Commonly known as: GLUCOPHAGE Take 1,000 mg by mouth 2 (two) times daily with a meal.  metoprolol tartrate 25 MG tablet Commonly known as: LOPRESSOR Take 12.5 mg by mouth at bedtime.   tamsulosin  0.4 MG Caps capsule Commonly known as: FLOMAX  Take 1 capsule (0.4 mg total) by mouth at bedtime.   Ubrelvy 50 MG Tabs Generic drug: Ubrogepant Take 50 mg by mouth daily as needed (migraines).   vitamin B-12 100 MCG tablet Commonly known as: CYANOCOBALAMIN Take 100 mcg by mouth daily.   zonisamide 100 MG capsule Commonly known as: ZONEGRAN Take 200 mg by mouth at bedtime.        Allergies:  Allergies  Allergen Reactions   Metoprolol     Can not take in high doses     Family History: Family History  Problem Relation Age of Onset   Heart disease Mother    Kidney disease Father     Social  History: See HPI for pertinent social history  ROS: Pertinent ROS in HPI  Physical Exam: There were no vitals taken for this visit.  Constitutional:  Well nourished. Alert and oriented, No acute distress. HEENT: [place] AT, moist mucus membranes.  Trachea midline, no masses. Cardiovascular: No clubbing, cyanosis, or edema. Respiratory: Normal respiratory effort, no increased work of breathing. GI: Abdomen is soft, non tender, non distended, no abdominal masses. Liver and spleen not palpable.  No hernias appreciated.  Stool sample for occult testing is not indicated.   GU: No CVA tenderness.  No bladder fullness or masses.  Patient with circumcised/uncircumcised phallus. ***Foreskin easily retracted***  Urethral meatus is patent.  No penile discharge. No penile lesions or rashes. Scrotum without lesions, cysts, rashes and/or edema.  Testicles are located scrotally bilaterally. No masses are appreciated in the testicles. Left and right epididymis are normal. Rectal: Patient with  normal sphincter tone. Anus and perineum without scarring or rashes. No rectal masses are appreciated. Prostate is approximately *** grams, *** nodules are appreciated. Seminal vesicles are normal. Skin: No rashes, bruises or suspicious lesions. Lymph: No cervical or inguinal adenopathy. Neurologic: Grossly intact, no focal deficits, moving all 4 extremities. Psychiatric: Normal mood and affect.  Laboratory Data: See EPIC and HPI  I have reviewed the labs.   Pertinent Imaging: N/A  Assessment & Plan:  ***  1. Gross hematuria  - we discussed that there are a number of causes that can be associated with blood in the urine, such as stones, BPH, UTI's, damage to the urinary tract and/or cancer.   Sometimes, we do not find a cause or source of the hematuria.    -we discussed that new guidelines place individuals into risk categories of low, intermediate and high risk categories.  These factors are based on age, smoking  history and degree of blood in urine.    -we discussed that at this time, they are in the *** risk stratification   -we discussed that the recommended protocol for further work up are are CT urogram and cysto *** RUS and cysto **** repeat UA in three months  -we discussed that for a CT urogram a contrast material will be injected into a vein and that in rare instances, an allergic reaction can result and may even life threatening (1:100,000)  The patient denies any allergies to contrast***, iodine and/or seafood*** and is not taking metformin.***  - Her reproductive status is hysterectomy, postmenopausal, tubal ligation are unknown at this time.  We will obtain a serum pregnancy test today. ***  - we discussed that following the imaging study,  a cystoscopy is performed   -  we discussed that a cystoscopy is a procedure that consists of passing a camera up their urethra after administering lidocaine  to anesthetize and that after the procedure a minor amount of blood in the urine and/or burning which usually resolves in 24 to 48 hours may occur ***  -the patient had the opportunity to ask questions which were answered. Based upon this discussion, the patient is willing to proceed. Therefore, I've ordered: a CT Urogram and cystoscopy ***  - The patient will return following all of the above for discussion of the results. *** - UA *** - Urine culture *** - BMP or recent creatinine ***  2. BPH with LU TS - stable, improving, worsening mild, moderate severe symptoms *** - no signs of retention, infection or malignancy *** - PSA up to date *** - DRE benign *** - UA benign *** - PVR < 300 cc *** - most bothersome symptoms are *** - encouraged avoiding bladder irritants, fluid restriction before bedtime and timed voiding's - Initiate alpha-blocker (***), discussed side effects *** - Initiate 5 alpha reductase inhibitor (***), discussed side effects *** - Continue tamsulosin  0.4 mg daily,  alfuzosin 10 mg daily, Rapaflo 8 mg daily, terazosin, doxazosin, Cialis 5 mg daily and finasteride  5 mg daily, dutasteride 0.5 mg daily***:refills given - Cannot tolerate medication or medication failure, schedule cystoscopy *** - educated on red flag symptoms: acute retention, gross hematuria, fever, severe pain - advised to call clinic or go to the ED if these occur - return to clinic in *** symptom re-evaluation ***     No follow-ups on file.  These notes generated with voice recognition software. I apologize for typographical errors.  CLOTILDA HELON RIGGERS  [facility] [address]  [address] [place], [place] [address] [phone]

## 2024-03-30 ENCOUNTER — Ambulatory Visit (INDEPENDENT_AMBULATORY_CARE_PROVIDER_SITE_OTHER): Admitting: Urology

## 2024-03-30 ENCOUNTER — Other Ambulatory Visit: Payer: Self-pay

## 2024-03-30 ENCOUNTER — Encounter: Payer: Self-pay | Admitting: Urology

## 2024-03-30 ENCOUNTER — Other Ambulatory Visit
Admission: RE | Admit: 2024-03-30 | Discharge: 2024-03-30 | Disposition: A | Discharge status: Home / Self Care | Attending: Urology | Admitting: Urology

## 2024-03-30 VITALS — BP 130/75 | HR 86 | Ht 71.0 in | Wt 193.6 lb

## 2024-03-30 DIAGNOSIS — N390 Urinary tract infection, site not specified: Secondary | ICD-10-CM

## 2024-03-30 DIAGNOSIS — N200 Calculus of kidney: Secondary | ICD-10-CM | POA: Diagnosis not present

## 2024-03-30 DIAGNOSIS — R310 Gross hematuria: Secondary | ICD-10-CM | POA: Diagnosis not present

## 2024-03-30 LAB — URINALYSIS, COMPLETE (UACMP) WITH MICROSCOPIC
Glucose, UA: NEGATIVE mg/dL
Hgb urine dipstick: NEGATIVE
Leukocytes,Ua: NEGATIVE
Nitrite: NEGATIVE
Protein, ur: 100 mg/dL — AB
Specific Gravity, Urine: >1.03 — ABNORMAL HIGH (ref 1.005–1.030)
pH: 5.5 (ref 5.0–8.0)

## 2024-03-30 NOTE — Patient Instructions (Signed)
 Please call [phone] for CT scheduling.  Cystoscopy Cystoscopy is a procedure that is used to help diagnose and sometimes treat conditions that affect the lower urinary tract. The lower urinary tract includes the bladder and the urethra. The urethra is the tube that drains urine from the bladder. Cystoscopy is done using a thin, tube-shaped instrument with a light and camera at the end (cystoscope). The cystoscope may be hard or flexible, depending on the goal of the procedure. The cystoscope is inserted through the urethra, into the bladder. Cystoscopy may be recommended if you have: Urinary tract infections that keep coming back. Blood in the urine (hematuria). An inability to control when you urinate (urinary incontinence) or an overactive bladder. Unusual cells found in a urine sample. A blockage in the urethra, such as a urinary stone. Painful urination. An abnormality in the bladder found during an intravenous pyelogram (IVP) or CT scan. What are the risks? Generally, this is a safe procedure. However, problems may occur, including: Infection. Bleeding.  What happens during the procedure?  You will be given one or more of the following: A medicine to numb the area (local anesthetic). The area around the opening of your urethra will be cleaned. The cystoscope will be passed through your urethra into your bladder. Germ-free (sterile) fluid will flow through the cystoscope to fill your bladder. The fluid will stretch your bladder so that your health care provider can clearly examine your bladder walls. Your doctor will look at the urethra and bladder. The cystoscope will be removed The procedure may vary among health care providers  What can I expect after the procedure? After the procedure, it is common to have: Some soreness or pain in your urethra. Urinary symptoms. These include: Mild pain or burning when you urinate. Pain should stop within a few minutes after you urinate.  This may last for up to a few days after the procedure. A small amount of blood in your urine for several days. Feeling like you need to urinate but producing only a small amount of urine. Follow these instructions at home: General instructions Return to your normal activities as told by your health care provider.  Drink plenty of fluids after the procedure. Keep all follow-up visits as told by your health care provider. This is important. Contact a health care provider if you: Have pain that gets worse or does not get better with medicine, especially pain when you urinate lasting longer than 72 hours after the procedure. Have trouble urinating. Get help right away if you: Have blood clots in your urine. Have a fever or chills. Are unable to urinate. Summary Cystoscopy is a procedure that is used to help diagnose and sometimes treat conditions that affect the lower urinary tract. Cystoscopy is done using a thin, tube-shaped instrument with a light and camera at the end. After the procedure, it is common to have some soreness or pain in your urethra. It is normal to have blood in your urine after the procedure.  If you were prescribed an antibiotic medicine, take it as told by your health care provider.  This information is not intended to replace advice given to you by your health care provider. Make sure you discuss any questions you have with your health care provider. Document Revised: 07/29/2018 Document Reviewed: 07/29/2018 Elsevier Patient Education  2020 [employer].

## 2024-04-01 ENCOUNTER — Other Ambulatory Visit: Payer: Self-pay

## 2024-04-01 ENCOUNTER — Ambulatory Visit: Payer: Self-pay | Admitting: Urology

## 2024-04-01 LAB — URINE CULTURE: Culture: 10000 — AB

## 2024-04-01 MED ORDER — NITROFURANTOIN MONOHYD MACRO 100 MG PO CAPS: 100.0000 mg | ORAL_CAPSULE | Freq: Two times a day (BID) | ORAL | 0 refills | Status: AC

## 2024-04-06 ENCOUNTER — Ambulatory Visit
Admission: RE | Admit: 2024-04-06 | Discharge: 2024-04-06 | Disposition: A | Discharge status: Home / Self Care | Source: Ambulatory Visit | Attending: Urology | Admitting: Urology

## 2024-04-06 DIAGNOSIS — N200 Calculus of kidney: Secondary | ICD-10-CM | POA: Diagnosis present

## 2024-04-06 DIAGNOSIS — R310 Gross hematuria: Secondary | ICD-10-CM | POA: Insufficient documentation

## 2024-04-06 LAB — POCT I-STAT CREATININE: Creatinine, Ser: 1.1 mg/dL (ref 0.61–1.24)

## 2024-04-06 MED ORDER — IOHEXOL 300 MG/ML  SOLN
100.0000 mL | Freq: Once | INTRAMUSCULAR | Status: AC | PRN
  Administered 2024-04-06: 100 mL via INTRAVENOUS

## 2024-04-15 ENCOUNTER — Ambulatory Visit: Admitting: Urology

## 2024-04-17 ENCOUNTER — Ambulatory Visit (INDEPENDENT_AMBULATORY_CARE_PROVIDER_SITE_OTHER): Admitting: Urology

## 2024-04-17 VITALS — BP 123/73 | HR 66 | Wt 195.0 lb

## 2024-04-17 DIAGNOSIS — R310 Gross hematuria: Secondary | ICD-10-CM

## 2024-04-17 DIAGNOSIS — N138 Other obstructive and reflux uropathy: Secondary | ICD-10-CM

## 2024-04-17 DIAGNOSIS — N390 Urinary tract infection, site not specified: Secondary | ICD-10-CM | POA: Diagnosis not present

## 2024-04-17 MED ORDER — LIDOCAINE HCL URETHRAL/MUCOSAL 2 % EX GEL
1.0000 | Freq: Once | CUTANEOUS | Status: AC
  Administered 2024-04-17: 1 via URETHRAL

## 2024-04-17 MED ORDER — SULFAMETHOXAZOLE-TRIMETHOPRIM 800-160 MG PO TABS
1.0000 | ORAL_TABLET | Freq: Once | ORAL | Status: AC
Start: 2024-04-17 — End: 2024-04-17
  Administered 2024-04-17: 1 via ORAL

## 2024-04-17 NOTE — Progress Notes (Signed)
 Cystoscopy Procedure Note:  Indication: Gross hematuria  Bactrim  given for prophylaxis  After informed consent and discussion of the procedure and its risks, Andres Young was positioned and prepped in the standard fashion. Cystoscopy was performed with a flexible cystoscope. The urethra, bladder neck and entire bladder was visualized in a standard fashion. The prostate was large with obstructing lateral lobes. The ureteral orifices were visualized in their normal location and orientation.  Bladder mucosa grossly normal throughout, significant intravesical protrusion on retroflexion.  Imaging: I personally viewed and interpreted the CT urogram dated 04/06/2024 showing 75 g prostate, no hydronephrosis or obstructive stones, no filling defects  Findings: Large prostate with significant intravesical protrusion  Assessment and Plan: Suspect BPH as etiology of gross hematuria, continue finasteride  and Flomax  Again reviewed HOLEP is an option in the future if recurrent gross hematuria or worsening urinary symptoms, he continues to defer Keep follow-up as scheduled  Redell Burnet, MD 04/17/2024

## 2024-04-17 NOTE — Patient Instructions (Signed)

## 2024-09-01 ENCOUNTER — Ambulatory Visit: Admitting: Urology

## 2024-09-01 VITALS — BP 115/63 | HR 62 | Ht 71.0 in | Wt 184.0 lb

## 2024-09-01 DIAGNOSIS — N401 Enlarged prostate with lower urinary tract symptoms: Secondary | ICD-10-CM

## 2024-09-01 DIAGNOSIS — N138 Other obstructive and reflux uropathy: Secondary | ICD-10-CM

## 2024-09-01 DIAGNOSIS — N390 Urinary tract infection, site not specified: Secondary | ICD-10-CM

## 2024-09-01 DIAGNOSIS — Z125 Encounter for screening for malignant neoplasm of prostate: Secondary | ICD-10-CM | POA: Diagnosis not present

## 2024-09-01 MED ORDER — TAMSULOSIN HCL 0.4 MG PO CAPS: 0.4000 mg | ORAL_CAPSULE | Freq: Every day | ORAL | 11 refills | Status: AC

## 2024-09-01 MED ORDER — FINASTERIDE 5 MG PO TABS: 5.0000 mg | ORAL_TABLET | Freq: Every day | ORAL | 3 refills | Status: AC

## 2024-09-01 MED ORDER — SULFAMETHOXAZOLE-TRIMETHOPRIM 800-160 MG PO TABS: 1.0000 | ORAL_TABLET | Freq: Two times a day (BID) | ORAL | 0 refills | Status: AC

## 2024-09-01 NOTE — Patient Instructions (Signed)

## 2024-09-01 NOTE — Progress Notes (Signed)
" ° °  09/01/2024 10:13 AM   Andres Young [birth year] [id number]  Reason for visit: Follow up BPH, gross hematuria, recurrent UTI, nephrolithiasis, PSA screening  History: Seen by me April 2022 for consideration of HOLEP, however prior to that visit had a complicated hospitalization at the [facility] with sepsis and obstructing stone requiring stent placement, also complicated by prostatitis/prostate abscess, and he ultimately deferred HOLEP.  Symptoms have improved significantly on maximal medical therapy with Flomax  and finasteride .  PVRs have been normal Cystoscopy for gross hematuria workup in August 2025 benign aside from enlarged prostate, suspect BPH as etiology of gross hematuria.  Prostate measured 75g at that time Extensive cardiac procedures in December 2024  Physical Exam: BP 115/63 (BP Location: Left Arm, Patient Position: Sitting, Cuff Size: Large)   Pulse 62   Ht 5' 11 (1.803 m)   Wt 184 lb (83.5 kg)   SpO2 97%   BMI 25.66 kg/m    Imaging/labs: PSA April 2025 1.57, corrected for finasteride  3.2 and normal for age.  Low PSA density 0.04 CT urogram August 2025, 75 g prostate, no hydronephrosis, small nonobstructive left midpole stone  Today: Here with his wife today who provides most of the history Had an episode of weakness and collapse in mid December 2025, seen at outside urgent care and reportedly diagnosed with  mild UTI and treated with antibiotics, neurology imaging/workup negative Recurrent episode of weakness/collapse a few weeks ago Unclear if TIA, other neurologic cause, or UTI, though has never had any urinary symptoms He has had some increased nocturia 2-3 times at night and some increased urgency during the day, incontinence has worsened overnight recently as well.  PVR today 0ml, unable to give urinalysis  Plan:   BPH: On maximal medical therapy with Flomax  and finasteride , has been on Flomax  long-term so unlikely to be related to recent sporadic neurologic  issues but could trial off Flomax  if no other etiology found with cardiology and neurology workup Recurrent UTI: Unclear if current intermittent symptoms related to UTI/prostatitis or other neurologic/cardiac etiology.  Urinalysis from mid December did appear infected, but those culture results are not available to me.  I think it is reasonable to try 3-week course of Bactrim  for possible prostatitis/UTI contributing to worsening neurologic issues PSA screening: Recent PSA normal, no further screening needed per the guideline recommendations based on age and comorbidities RTC 4 weeks symptom check, UA   Andres JAYSON Burnet, MD  [facility] [address], [address] [place], [place] [address] [phone]  "

## 2024-09-02 ENCOUNTER — Ambulatory Visit: Payer: Self-pay | Admitting: Urology

## 2024-10-13 ENCOUNTER — Ambulatory Visit: Admitting: Urology
